# Patient Record
Sex: Male | Born: 1955
Health system: Southern US, Community
[De-identification: ages and names within clinical notes are randomized; demographics above are authoritative.]

## PROBLEM LIST (undated history)

## (undated) DIAGNOSIS — J449 Chronic obstructive pulmonary disease, unspecified: Secondary | ICD-10-CM

## (undated) DIAGNOSIS — M5020 Other cervical disc displacement, unspecified cervical region: Secondary | ICD-10-CM

## (undated) DIAGNOSIS — R7309 Other abnormal glucose: Secondary | ICD-10-CM

## (undated) DIAGNOSIS — M722 Plantar fascial fibromatosis: Secondary | ICD-10-CM

## (undated) DIAGNOSIS — E7800 Pure hypercholesterolemia, unspecified: Secondary | ICD-10-CM

## (undated) DIAGNOSIS — I219 Acute myocardial infarction, unspecified: Secondary | ICD-10-CM

## (undated) DIAGNOSIS — J984 Other disorders of lung: Secondary | ICD-10-CM

## (undated) DIAGNOSIS — J4489 Other specified chronic obstructive pulmonary disease: Secondary | ICD-10-CM

## (undated) DIAGNOSIS — M67919 Unspecified disorder of synovium and tendon, unspecified shoulder: Secondary | ICD-10-CM

## (undated) DIAGNOSIS — I6529 Occlusion and stenosis of unspecified carotid artery: Secondary | ICD-10-CM

## (undated) DIAGNOSIS — F3289 Other specified depressive episodes: Secondary | ICD-10-CM

## (undated) DIAGNOSIS — M719 Bursopathy, unspecified: Secondary | ICD-10-CM

## (undated) DIAGNOSIS — M6740 Ganglion, unspecified site: Secondary | ICD-10-CM

## (undated) DIAGNOSIS — I7389 Other specified peripheral vascular diseases: Secondary | ICD-10-CM

## (undated) DIAGNOSIS — F329 Major depressive disorder, single episode, unspecified: Secondary | ICD-10-CM

## (undated) DIAGNOSIS — J45909 Unspecified asthma, uncomplicated: Secondary | ICD-10-CM

## (undated) DIAGNOSIS — E291 Testicular hypofunction: Secondary | ICD-10-CM

## (undated) DIAGNOSIS — I10 Essential (primary) hypertension: Secondary | ICD-10-CM

## (undated) DIAGNOSIS — F411 Generalized anxiety disorder: Secondary | ICD-10-CM

## (undated) DIAGNOSIS — E049 Nontoxic goiter, unspecified: Secondary | ICD-10-CM

## (undated) DIAGNOSIS — I2510 Atherosclerotic heart disease of native coronary artery without angina pectoris: Secondary | ICD-10-CM

## (undated) DIAGNOSIS — G5600 Carpal tunnel syndrome, unspecified upper limb: Secondary | ICD-10-CM

## (undated) HISTORY — DX: Unspecified asthma, uncomplicated: J45.909

## (undated) HISTORY — DX: Unspecified disorder of synovium and tendon, unspecified shoulder: M67.919

## (undated) HISTORY — DX: Other specified peripheral vascular diseases: I73.89

## (undated) HISTORY — DX: Major depressive disorder, single episode, unspecified: F32.9

## (undated) HISTORY — DX: Occlusion and stenosis of unspecified carotid artery: I65.29

## (undated) HISTORY — DX: Other specified chronic obstructive pulmonary disease: J44.89

## (undated) HISTORY — DX: Acute myocardial infarction, unspecified: I21.9

## (undated) HISTORY — DX: Chronic obstructive pulmonary disease, unspecified: J44.9

## (undated) HISTORY — DX: Testicular hypofunction: E29.1

## (undated) HISTORY — DX: Other specified depressive episodes: F32.89

## (undated) HISTORY — DX: Nontoxic goiter, unspecified: E04.9

## (undated) HISTORY — DX: Other disorders of lung: J98.4

## (undated) HISTORY — DX: Other abnormal glucose: R73.09

## (undated) HISTORY — DX: Carpal tunnel syndrome, unspecified upper limb: G56.00

## (undated) HISTORY — DX: Other cervical disc displacement, unspecified cervical region: M50.20

## (undated) HISTORY — DX: Bursopathy, unspecified: M71.9

## (undated) HISTORY — DX: Ganglion, unspecified site: M67.40

## (undated) HISTORY — PX: CARDIAC CATHETERIZATION: SHX172

## (undated) HISTORY — DX: Plantar fascial fibromatosis: M72.2

## (undated) HISTORY — PX: ANGIOPLASTY / STENTING FEMORAL: SUR30

## (undated) HISTORY — DX: Pure hypercholesterolemia, unspecified: E78.00

## (undated) HISTORY — DX: Generalized anxiety disorder: F41.1

---

## 1998-09-13 ENCOUNTER — Encounter: Payer: Self-pay | Admitting: Emergency Medicine

## 1998-09-13 ENCOUNTER — Emergency Department (HOSPITAL_COMMUNITY): Admission: EM | Admit: 1998-09-13 | Discharge: 1998-09-13 | Payer: Self-pay | Admitting: Emergency Medicine

## 1999-09-02 ENCOUNTER — Emergency Department (HOSPITAL_COMMUNITY): Admission: EM | Admit: 1999-09-02 | Discharge: 1999-09-02 | Payer: Self-pay | Admitting: Emergency Medicine

## 1999-09-07 ENCOUNTER — Emergency Department (HOSPITAL_COMMUNITY): Admission: EM | Admit: 1999-09-07 | Discharge: 1999-09-07 | Payer: Self-pay | Admitting: Emergency Medicine

## 1999-09-07 ENCOUNTER — Encounter: Payer: Self-pay | Admitting: Emergency Medicine

## 2000-02-22 ENCOUNTER — Emergency Department (HOSPITAL_COMMUNITY): Admission: EM | Admit: 2000-02-22 | Discharge: 2000-02-22 | Payer: Self-pay | Admitting: *Deleted

## 2000-02-27 ENCOUNTER — Emergency Department (HOSPITAL_COMMUNITY): Admission: EM | Admit: 2000-02-27 | Discharge: 2000-02-27 | Payer: Self-pay | Admitting: Emergency Medicine

## 2000-03-09 ENCOUNTER — Emergency Department (HOSPITAL_COMMUNITY): Admission: EM | Admit: 2000-03-09 | Discharge: 2000-03-09 | Payer: Self-pay | Admitting: Emergency Medicine

## 2000-03-09 ENCOUNTER — Encounter: Payer: Self-pay | Admitting: Emergency Medicine

## 2000-03-25 ENCOUNTER — Emergency Department (HOSPITAL_COMMUNITY): Admission: EM | Admit: 2000-03-25 | Discharge: 2000-03-25 | Payer: Self-pay | Admitting: Emergency Medicine

## 2000-08-28 ENCOUNTER — Emergency Department (HOSPITAL_COMMUNITY): Admission: EM | Admit: 2000-08-28 | Discharge: 2000-08-28 | Payer: Self-pay | Admitting: *Deleted

## 2000-12-11 ENCOUNTER — Encounter: Payer: Self-pay | Admitting: Emergency Medicine

## 2000-12-11 ENCOUNTER — Emergency Department (HOSPITAL_COMMUNITY): Admission: EM | Admit: 2000-12-11 | Discharge: 2000-12-11 | Payer: Self-pay | Admitting: Emergency Medicine

## 2001-01-08 ENCOUNTER — Encounter: Payer: Self-pay | Admitting: Emergency Medicine

## 2001-01-08 ENCOUNTER — Emergency Department (HOSPITAL_COMMUNITY): Admission: EM | Admit: 2001-01-08 | Discharge: 2001-01-08 | Payer: Self-pay | Admitting: Emergency Medicine

## 2001-08-27 ENCOUNTER — Emergency Department (HOSPITAL_COMMUNITY): Admission: EM | Admit: 2001-08-27 | Discharge: 2001-08-27 | Payer: Self-pay | Admitting: Emergency Medicine

## 2001-08-27 ENCOUNTER — Encounter: Payer: Self-pay | Admitting: Emergency Medicine

## 2002-05-17 ENCOUNTER — Emergency Department (HOSPITAL_COMMUNITY): Admission: EM | Admit: 2002-05-17 | Discharge: 2002-05-17 | Payer: Self-pay | Admitting: Emergency Medicine

## 2002-07-09 ENCOUNTER — Emergency Department (HOSPITAL_COMMUNITY): Admission: EM | Admit: 2002-07-09 | Discharge: 2002-07-09 | Payer: Self-pay | Admitting: Emergency Medicine

## 2002-09-10 ENCOUNTER — Emergency Department (HOSPITAL_COMMUNITY): Admission: EM | Admit: 2002-09-10 | Discharge: 2002-09-10 | Payer: Self-pay | Admitting: Emergency Medicine

## 2005-04-10 ENCOUNTER — Emergency Department (HOSPITAL_COMMUNITY): Admission: EM | Admit: 2005-04-10 | Discharge: 2005-04-10 | Payer: Self-pay | Admitting: Emergency Medicine

## 2006-01-02 ENCOUNTER — Ambulatory Visit: Payer: Self-pay | Admitting: *Deleted

## 2006-01-02 ENCOUNTER — Inpatient Hospital Stay (HOSPITAL_COMMUNITY): Admission: AD | Admit: 2006-01-02 | Discharge: 2006-01-08 | Payer: Self-pay | Admitting: *Deleted

## 2006-01-02 ENCOUNTER — Encounter: Payer: Self-pay | Admitting: Emergency Medicine

## 2006-01-02 DIAGNOSIS — J984 Other disorders of lung: Secondary | ICD-10-CM | POA: Insufficient documentation

## 2006-02-16 ENCOUNTER — Emergency Department (HOSPITAL_COMMUNITY): Admission: EM | Admit: 2006-02-16 | Discharge: 2006-02-16 | Payer: Self-pay | Admitting: Emergency Medicine

## 2006-03-14 ENCOUNTER — Emergency Department (HOSPITAL_COMMUNITY): Admission: EM | Admit: 2006-03-14 | Discharge: 2006-03-14 | Payer: Self-pay | Admitting: Emergency Medicine

## 2006-05-04 ENCOUNTER — Emergency Department (HOSPITAL_COMMUNITY): Admission: EM | Admit: 2006-05-04 | Discharge: 2006-05-04 | Payer: Self-pay | Admitting: Emergency Medicine

## 2006-05-04 DIAGNOSIS — M719 Bursopathy, unspecified: Secondary | ICD-10-CM

## 2006-05-04 DIAGNOSIS — M5020 Other cervical disc displacement, unspecified cervical region: Secondary | ICD-10-CM | POA: Insufficient documentation

## 2006-05-04 DIAGNOSIS — M67919 Unspecified disorder of synovium and tendon, unspecified shoulder: Secondary | ICD-10-CM | POA: Insufficient documentation

## 2006-05-10 ENCOUNTER — Emergency Department (HOSPITAL_COMMUNITY): Admission: EM | Admit: 2006-05-10 | Discharge: 2006-05-10 | Payer: Self-pay | Admitting: Emergency Medicine

## 2006-05-24 ENCOUNTER — Ambulatory Visit: Payer: Self-pay | Admitting: Internal Medicine

## 2006-05-25 ENCOUNTER — Emergency Department (HOSPITAL_COMMUNITY): Admission: EM | Admit: 2006-05-25 | Discharge: 2006-05-25 | Payer: Self-pay | Admitting: Family Medicine

## 2006-06-08 ENCOUNTER — Ambulatory Visit: Payer: Self-pay | Admitting: *Deleted

## 2006-06-14 ENCOUNTER — Ambulatory Visit: Payer: Self-pay | Admitting: Internal Medicine

## 2006-06-18 ENCOUNTER — Ambulatory Visit (HOSPITAL_COMMUNITY): Admission: RE | Admit: 2006-06-18 | Discharge: 2006-06-18 | Payer: Self-pay | Admitting: Internal Medicine

## 2006-06-26 ENCOUNTER — Emergency Department (HOSPITAL_COMMUNITY): Admission: EM | Admit: 2006-06-26 | Discharge: 2006-06-26 | Payer: Self-pay | Admitting: Emergency Medicine

## 2006-06-26 ENCOUNTER — Emergency Department (HOSPITAL_COMMUNITY): Admission: EM | Admit: 2006-06-26 | Discharge: 2006-06-26 | Payer: Self-pay | Admitting: Family Medicine

## 2006-06-28 ENCOUNTER — Ambulatory Visit: Payer: Self-pay | Admitting: Internal Medicine

## 2006-07-03 ENCOUNTER — Ambulatory Visit: Payer: Self-pay | Admitting: Internal Medicine

## 2006-08-04 ENCOUNTER — Emergency Department (HOSPITAL_COMMUNITY): Admission: EM | Admit: 2006-08-04 | Discharge: 2006-08-04 | Payer: Self-pay | Admitting: Family Medicine

## 2006-10-14 ENCOUNTER — Encounter (INDEPENDENT_AMBULATORY_CARE_PROVIDER_SITE_OTHER): Payer: Self-pay | Admitting: Internal Medicine

## 2006-10-14 ENCOUNTER — Inpatient Hospital Stay (HOSPITAL_COMMUNITY): Admission: EM | Admit: 2006-10-14 | Discharge: 2006-10-15 | Payer: Self-pay | Admitting: Emergency Medicine

## 2006-10-15 ENCOUNTER — Encounter (INDEPENDENT_AMBULATORY_CARE_PROVIDER_SITE_OTHER): Payer: Self-pay | Admitting: *Deleted

## 2007-01-01 ENCOUNTER — Encounter (INDEPENDENT_AMBULATORY_CARE_PROVIDER_SITE_OTHER): Payer: Self-pay | Admitting: Internal Medicine

## 2007-01-05 DIAGNOSIS — F1021 Alcohol dependence, in remission: Secondary | ICD-10-CM | POA: Insufficient documentation

## 2007-01-05 DIAGNOSIS — Z8639 Personal history of other endocrine, nutritional and metabolic disease: Secondary | ICD-10-CM

## 2007-01-05 DIAGNOSIS — G4700 Insomnia, unspecified: Secondary | ICD-10-CM | POA: Insufficient documentation

## 2007-01-05 DIAGNOSIS — F17200 Nicotine dependence, unspecified, uncomplicated: Secondary | ICD-10-CM | POA: Insufficient documentation

## 2007-01-05 DIAGNOSIS — F411 Generalized anxiety disorder: Secondary | ICD-10-CM | POA: Insufficient documentation

## 2007-01-05 DIAGNOSIS — Z862 Personal history of diseases of the blood and blood-forming organs and certain disorders involving the immune mechanism: Secondary | ICD-10-CM | POA: Insufficient documentation

## 2007-01-09 ENCOUNTER — Encounter (INDEPENDENT_AMBULATORY_CARE_PROVIDER_SITE_OTHER): Payer: Self-pay | Admitting: *Deleted

## 2007-01-14 ENCOUNTER — Telehealth (INDEPENDENT_AMBULATORY_CARE_PROVIDER_SITE_OTHER): Payer: Self-pay | Admitting: Internal Medicine

## 2007-01-14 ENCOUNTER — Telehealth (INDEPENDENT_AMBULATORY_CARE_PROVIDER_SITE_OTHER): Payer: Self-pay | Admitting: *Deleted

## 2007-01-15 ENCOUNTER — Telehealth (INDEPENDENT_AMBULATORY_CARE_PROVIDER_SITE_OTHER): Payer: Self-pay | Admitting: Internal Medicine

## 2007-01-21 ENCOUNTER — Ambulatory Visit: Payer: Self-pay | Admitting: Nurse Practitioner

## 2007-01-21 DIAGNOSIS — K047 Periapical abscess without sinus: Secondary | ICD-10-CM | POA: Insufficient documentation

## 2007-01-21 DIAGNOSIS — J45909 Unspecified asthma, uncomplicated: Secondary | ICD-10-CM | POA: Insufficient documentation

## 2007-01-21 DIAGNOSIS — R21 Rash and other nonspecific skin eruption: Secondary | ICD-10-CM | POA: Insufficient documentation

## 2007-01-21 LAB — CONVERTED CEMR LAB
Eosinophils Absolute: 0.2 10*3/uL (ref 0.0–0.7)
Eosinophils Relative: 4 % (ref 0–5)
HCT: 46.7 % (ref 39.0–52.0)
Hemoglobin: 15.2 g/dL (ref 13.0–17.0)
Lymphocytes Relative: 32 % (ref 12–46)
Lymphs Abs: 2.2 10*3/uL (ref 0.7–3.3)
MCV: 95.3 fL (ref 78.0–100.0)
Monocytes Relative: 9 % (ref 3–11)
Platelets: 321 10*3/uL (ref 150–400)
RBC: 4.9 M/uL (ref 4.22–5.81)
WBC: 6.9 10*3/uL (ref 4.0–10.5)

## 2007-01-22 ENCOUNTER — Encounter (INDEPENDENT_AMBULATORY_CARE_PROVIDER_SITE_OTHER): Payer: Self-pay | Admitting: Internal Medicine

## 2007-01-28 ENCOUNTER — Telehealth (INDEPENDENT_AMBULATORY_CARE_PROVIDER_SITE_OTHER): Payer: Self-pay | Admitting: *Deleted

## 2007-01-29 ENCOUNTER — Encounter (INDEPENDENT_AMBULATORY_CARE_PROVIDER_SITE_OTHER): Payer: Self-pay | Admitting: Internal Medicine

## 2007-02-13 ENCOUNTER — Ambulatory Visit: Payer: Self-pay | Admitting: Nurse Practitioner

## 2007-02-18 ENCOUNTER — Encounter (INDEPENDENT_AMBULATORY_CARE_PROVIDER_SITE_OTHER): Payer: Self-pay | Admitting: Nurse Practitioner

## 2007-04-07 DIAGNOSIS — F329 Major depressive disorder, single episode, unspecified: Secondary | ICD-10-CM

## 2007-04-07 DIAGNOSIS — F3289 Other specified depressive episodes: Secondary | ICD-10-CM | POA: Insufficient documentation

## 2007-04-29 ENCOUNTER — Telehealth (INDEPENDENT_AMBULATORY_CARE_PROVIDER_SITE_OTHER): Payer: Self-pay | Admitting: Nurse Practitioner

## 2007-05-03 ENCOUNTER — Telehealth (INDEPENDENT_AMBULATORY_CARE_PROVIDER_SITE_OTHER): Payer: Self-pay | Admitting: Internal Medicine

## 2007-05-23 ENCOUNTER — Ambulatory Visit: Payer: Self-pay | Admitting: Internal Medicine

## 2007-05-23 DIAGNOSIS — M722 Plantar fascial fibromatosis: Secondary | ICD-10-CM | POA: Insufficient documentation

## 2007-05-23 DIAGNOSIS — M25559 Pain in unspecified hip: Secondary | ICD-10-CM | POA: Insufficient documentation

## 2007-05-23 LAB — CONVERTED CEMR LAB
Bilirubin Urine: NEGATIVE
Glucose, Urine, Semiquant: NEGATIVE
Protein, U semiquant: NEGATIVE
Specific Gravity, Urine: <1.005
Urobilinogen, UA: NEGATIVE
WBC Urine, dipstick: NEGATIVE

## 2007-05-27 ENCOUNTER — Ambulatory Visit (HOSPITAL_COMMUNITY): Admission: RE | Admit: 2007-05-27 | Discharge: 2007-05-27 | Payer: Self-pay | Admitting: Internal Medicine

## 2007-05-27 ENCOUNTER — Encounter (INDEPENDENT_AMBULATORY_CARE_PROVIDER_SITE_OTHER): Payer: Self-pay | Admitting: Internal Medicine

## 2007-05-27 LAB — CONVERTED CEMR LAB
BUN: 13 mg/dL (ref 6–23)
Chloride: 102 meq/L (ref 96–112)
Creatinine, Ser: 0.81 mg/dL (ref 0.40–1.50)
Glucose, Bld: 122 mg/dL — ABNORMAL HIGH (ref 70–99)
Potassium: 4.5 meq/L (ref 3.5–5.3)

## 2007-05-30 ENCOUNTER — Ambulatory Visit: Payer: Self-pay | Admitting: Internal Medicine

## 2007-08-20 ENCOUNTER — Telehealth (INDEPENDENT_AMBULATORY_CARE_PROVIDER_SITE_OTHER): Payer: Self-pay | Admitting: Internal Medicine

## 2007-09-12 ENCOUNTER — Ambulatory Visit: Payer: Self-pay | Admitting: Internal Medicine

## 2007-09-12 DIAGNOSIS — I10 Essential (primary) hypertension: Secondary | ICD-10-CM | POA: Insufficient documentation

## 2007-09-12 DIAGNOSIS — G5600 Carpal tunnel syndrome, unspecified upper limb: Secondary | ICD-10-CM | POA: Insufficient documentation

## 2007-10-31 ENCOUNTER — Ambulatory Visit: Payer: Self-pay | Admitting: Internal Medicine

## 2007-11-04 ENCOUNTER — Telehealth (INDEPENDENT_AMBULATORY_CARE_PROVIDER_SITE_OTHER): Payer: Self-pay | Admitting: Nurse Practitioner

## 2007-11-04 ENCOUNTER — Ambulatory Visit (HOSPITAL_COMMUNITY): Admission: RE | Admit: 2007-11-04 | Discharge: 2007-11-04 | Payer: Self-pay | Admitting: Internal Medicine

## 2008-01-13 ENCOUNTER — Telehealth (INDEPENDENT_AMBULATORY_CARE_PROVIDER_SITE_OTHER): Payer: Self-pay | Admitting: Internal Medicine

## 2008-01-21 ENCOUNTER — Encounter (INDEPENDENT_AMBULATORY_CARE_PROVIDER_SITE_OTHER): Payer: Self-pay | Admitting: Internal Medicine

## 2008-01-27 ENCOUNTER — Telehealth (INDEPENDENT_AMBULATORY_CARE_PROVIDER_SITE_OTHER): Payer: Self-pay | Admitting: Internal Medicine

## 2008-01-28 ENCOUNTER — Encounter (INDEPENDENT_AMBULATORY_CARE_PROVIDER_SITE_OTHER): Payer: Self-pay | Admitting: Internal Medicine

## 2008-02-11 ENCOUNTER — Encounter: Admission: RE | Admit: 2008-02-11 | Discharge: 2008-02-11 | Payer: Self-pay | Admitting: Neurosurgery

## 2008-02-19 ENCOUNTER — Ambulatory Visit (HOSPITAL_COMMUNITY): Admission: RE | Admit: 2008-02-19 | Discharge: 2008-02-20 | Payer: Self-pay | Admitting: Neurosurgery

## 2008-02-20 ENCOUNTER — Encounter (INDEPENDENT_AMBULATORY_CARE_PROVIDER_SITE_OTHER): Payer: Self-pay | Admitting: *Deleted

## 2008-03-24 ENCOUNTER — Encounter (INDEPENDENT_AMBULATORY_CARE_PROVIDER_SITE_OTHER): Payer: Self-pay | Admitting: Internal Medicine

## 2008-04-23 ENCOUNTER — Ambulatory Visit: Payer: Self-pay | Admitting: Internal Medicine

## 2008-05-06 ENCOUNTER — Telehealth (INDEPENDENT_AMBULATORY_CARE_PROVIDER_SITE_OTHER): Payer: Self-pay | Admitting: Internal Medicine

## 2008-05-22 ENCOUNTER — Ambulatory Visit: Payer: Self-pay | Admitting: Internal Medicine

## 2008-05-22 LAB — CONVERTED CEMR LAB
Nitrite: NEGATIVE
Protein, U semiquant: NEGATIVE
Specific Gravity, Urine: <1.005
WBC Urine, dipstick: NEGATIVE

## 2008-06-06 DIAGNOSIS — E7800 Pure hypercholesterolemia, unspecified: Secondary | ICD-10-CM | POA: Insufficient documentation

## 2008-06-06 LAB — CONVERTED CEMR LAB
CO2: 23 meq/L (ref 19–32)
Calcium: 9.4 mg/dL (ref 8.4–10.5)
Cholesterol: 266 mg/dL — ABNORMAL HIGH (ref 0–200)
Creatinine, Ser: 0.61 mg/dL (ref 0.40–1.50)
Eosinophils Relative: 3 % (ref 0–5)
Glucose, Bld: 111 mg/dL — ABNORMAL HIGH (ref 70–99)
Lymphocytes Relative: 26 % (ref 12–46)
Lymphs Abs: 1.9 10*3/uL (ref 0.7–4.0)
MCHC: 32.1 g/dL (ref 30.0–36.0)
MCV: 94.9 fL (ref 78.0–100.0)
Monocytes Absolute: 0.6 10*3/uL (ref 0.1–1.0)
Monocytes Relative: 8 % (ref 3–12)
Platelets: 310 10*3/uL (ref 150–400)
RDW: 15.3 % (ref 11.5–15.5)
Total Bilirubin: 0.3 mg/dL (ref 0.3–1.2)
Total CHOL/HDL Ratio: 4.3
Triglycerides: 130 mg/dL (ref <150)
VLDL: 26 mg/dL (ref 0–40)

## 2008-06-26 ENCOUNTER — Ambulatory Visit: Payer: Self-pay | Admitting: Internal Medicine

## 2008-06-26 DIAGNOSIS — E049 Nontoxic goiter, unspecified: Secondary | ICD-10-CM | POA: Insufficient documentation

## 2008-06-26 DIAGNOSIS — R7309 Other abnormal glucose: Secondary | ICD-10-CM | POA: Insufficient documentation

## 2008-06-26 DIAGNOSIS — M6740 Ganglion, unspecified site: Secondary | ICD-10-CM | POA: Insufficient documentation

## 2008-06-26 LAB — CONVERTED CEMR LAB
Bilirubin Urine: NEGATIVE
Protein, U semiquant: NEGATIVE
Urobilinogen, UA: 0.2
WBC Urine, dipstick: NEGATIVE

## 2008-06-29 ENCOUNTER — Encounter (INDEPENDENT_AMBULATORY_CARE_PROVIDER_SITE_OTHER): Payer: Self-pay | Admitting: Internal Medicine

## 2008-07-06 ENCOUNTER — Encounter (INDEPENDENT_AMBULATORY_CARE_PROVIDER_SITE_OTHER): Payer: Self-pay | Admitting: Internal Medicine

## 2008-07-10 ENCOUNTER — Ambulatory Visit: Payer: Self-pay | Admitting: Internal Medicine

## 2008-07-13 ENCOUNTER — Telehealth (INDEPENDENT_AMBULATORY_CARE_PROVIDER_SITE_OTHER): Payer: Self-pay | Admitting: Internal Medicine

## 2008-07-13 ENCOUNTER — Emergency Department (HOSPITAL_COMMUNITY): Admission: EM | Admit: 2008-07-13 | Discharge: 2008-07-13 | Payer: Self-pay | Admitting: Emergency Medicine

## 2008-08-18 ENCOUNTER — Ambulatory Visit: Payer: Self-pay | Admitting: Internal Medicine

## 2008-08-18 DIAGNOSIS — M79609 Pain in unspecified limb: Secondary | ICD-10-CM | POA: Insufficient documentation

## 2008-08-27 ENCOUNTER — Ambulatory Visit (HOSPITAL_COMMUNITY): Admission: RE | Admit: 2008-08-27 | Discharge: 2008-08-27 | Payer: Self-pay | Admitting: Internal Medicine

## 2008-08-27 ENCOUNTER — Ambulatory Visit: Payer: Self-pay | Admitting: Vascular Surgery

## 2008-08-27 ENCOUNTER — Encounter (INDEPENDENT_AMBULATORY_CARE_PROVIDER_SITE_OTHER): Payer: Self-pay | Admitting: Internal Medicine

## 2008-08-28 ENCOUNTER — Ambulatory Visit: Payer: Self-pay | Admitting: Internal Medicine

## 2008-08-28 DIAGNOSIS — I7389 Other specified peripheral vascular diseases: Secondary | ICD-10-CM | POA: Insufficient documentation

## 2008-08-28 DIAGNOSIS — M5137 Other intervertebral disc degeneration, lumbosacral region: Secondary | ICD-10-CM | POA: Insufficient documentation

## 2008-09-07 ENCOUNTER — Telehealth (INDEPENDENT_AMBULATORY_CARE_PROVIDER_SITE_OTHER): Payer: Self-pay | Admitting: Internal Medicine

## 2008-09-16 ENCOUNTER — Ambulatory Visit (HOSPITAL_COMMUNITY): Admission: RE | Admit: 2008-09-16 | Discharge: 2008-09-16 | Payer: Self-pay | Admitting: Internal Medicine

## 2008-09-27 ENCOUNTER — Ambulatory Visit: Payer: Self-pay | Admitting: *Deleted

## 2008-09-27 ENCOUNTER — Inpatient Hospital Stay (HOSPITAL_COMMUNITY): Admission: EM | Admit: 2008-09-27 | Discharge: 2008-09-29 | Payer: Self-pay | Admitting: Emergency Medicine

## 2008-09-27 DIAGNOSIS — I219 Acute myocardial infarction, unspecified: Secondary | ICD-10-CM | POA: Insufficient documentation

## 2008-09-27 DIAGNOSIS — I2510 Atherosclerotic heart disease of native coronary artery without angina pectoris: Secondary | ICD-10-CM | POA: Insufficient documentation

## 2008-09-30 ENCOUNTER — Telehealth (INDEPENDENT_AMBULATORY_CARE_PROVIDER_SITE_OTHER): Payer: Self-pay | Admitting: Internal Medicine

## 2008-10-01 ENCOUNTER — Telehealth (INDEPENDENT_AMBULATORY_CARE_PROVIDER_SITE_OTHER): Payer: Self-pay | Admitting: Internal Medicine

## 2008-10-07 ENCOUNTER — Ambulatory Visit: Payer: Self-pay | Admitting: Family Medicine

## 2008-10-13 ENCOUNTER — Encounter (INDEPENDENT_AMBULATORY_CARE_PROVIDER_SITE_OTHER): Payer: Self-pay | Admitting: Internal Medicine

## 2008-10-30 ENCOUNTER — Ambulatory Visit: Payer: Self-pay | Admitting: Internal Medicine

## 2008-10-30 LAB — CONVERTED CEMR LAB
ALT: 12 units/L (ref 0–53)
Albumin: 4.3 g/dL (ref 3.5–5.2)
CO2: 22 meq/L (ref 19–32)
Calcium: 9.4 mg/dL (ref 8.4–10.5)
Chloride: 104 meq/L (ref 96–112)
Cholesterol: 112 mg/dL (ref 0–200)
Glucose, Bld: 99 mg/dL (ref 70–99)
Sodium: 140 meq/L (ref 135–145)
Total Bilirubin: 0.3 mg/dL (ref 0.3–1.2)
Total Protein: 7.7 g/dL (ref 6.0–8.3)
Triglycerides: 101 mg/dL (ref <150)
VLDL: 20 mg/dL (ref 0–40)

## 2008-11-05 ENCOUNTER — Encounter (HOSPITAL_COMMUNITY): Admission: RE | Admit: 2008-11-05 | Discharge: 2008-12-23 | Payer: Self-pay | Admitting: Cardiology

## 2008-11-16 ENCOUNTER — Encounter (INDEPENDENT_AMBULATORY_CARE_PROVIDER_SITE_OTHER): Payer: Self-pay | Admitting: Internal Medicine

## 2008-11-24 ENCOUNTER — Encounter (INDEPENDENT_AMBULATORY_CARE_PROVIDER_SITE_OTHER): Payer: Self-pay | Admitting: Internal Medicine

## 2008-12-31 ENCOUNTER — Ambulatory Visit: Payer: Self-pay | Admitting: Internal Medicine

## 2008-12-31 LAB — CONVERTED CEMR LAB
Basophils Absolute: 0 10*3/uL (ref 0.0–0.1)
Basophils Relative: 0 % (ref 0–1)
Eosinophils Absolute: 0.3 10*3/uL (ref 0.0–0.7)
Eosinophils Relative: 3 % (ref 0–5)
HCT: 43 % (ref 39.0–52.0)
Hemoglobin: 14.2 g/dL (ref 13.0–17.0)
MCHC: 33 g/dL (ref 30.0–36.0)
MCV: 92.5 fL (ref 78.0–100.0)
Monocytes Absolute: 0.9 10*3/uL (ref 0.1–1.0)
Monocytes Relative: 12 % (ref 3–12)
Prothrombin Time: 13.2 s (ref 11.6–15.2)
RBC: 4.65 M/uL (ref 4.22–5.81)
RDW: 14.5 % (ref 11.5–15.5)

## 2009-01-18 ENCOUNTER — Encounter (INDEPENDENT_AMBULATORY_CARE_PROVIDER_SITE_OTHER): Payer: Self-pay | Admitting: Internal Medicine

## 2009-02-26 ENCOUNTER — Ambulatory Visit: Payer: Self-pay | Admitting: Internal Medicine

## 2009-02-26 DIAGNOSIS — M25579 Pain in unspecified ankle and joints of unspecified foot: Secondary | ICD-10-CM | POA: Insufficient documentation

## 2009-03-03 ENCOUNTER — Encounter (INDEPENDENT_AMBULATORY_CARE_PROVIDER_SITE_OTHER): Payer: Self-pay | Admitting: Internal Medicine

## 2009-03-04 ENCOUNTER — Encounter (INDEPENDENT_AMBULATORY_CARE_PROVIDER_SITE_OTHER): Payer: Self-pay | Admitting: Internal Medicine

## 2009-03-05 ENCOUNTER — Encounter (HOSPITAL_COMMUNITY): Admission: RE | Admit: 2009-03-05 | Discharge: 2009-04-21 | Payer: Self-pay | Admitting: Cardiology

## 2009-03-12 ENCOUNTER — Ambulatory Visit: Payer: Self-pay | Admitting: Internal Medicine

## 2009-04-10 ENCOUNTER — Emergency Department (HOSPITAL_COMMUNITY): Admission: EM | Admit: 2009-04-10 | Discharge: 2009-04-10 | Payer: Self-pay | Admitting: Emergency Medicine

## 2009-05-03 ENCOUNTER — Encounter (INDEPENDENT_AMBULATORY_CARE_PROVIDER_SITE_OTHER): Payer: Self-pay | Admitting: Internal Medicine

## 2009-05-18 ENCOUNTER — Ambulatory Visit: Payer: Self-pay | Admitting: Internal Medicine

## 2009-05-18 DIAGNOSIS — R252 Cramp and spasm: Secondary | ICD-10-CM | POA: Insufficient documentation

## 2009-05-18 DIAGNOSIS — N529 Male erectile dysfunction, unspecified: Secondary | ICD-10-CM | POA: Insufficient documentation

## 2009-05-21 ENCOUNTER — Telehealth (INDEPENDENT_AMBULATORY_CARE_PROVIDER_SITE_OTHER): Payer: Self-pay | Admitting: Internal Medicine

## 2009-05-25 ENCOUNTER — Telehealth (INDEPENDENT_AMBULATORY_CARE_PROVIDER_SITE_OTHER): Payer: Self-pay | Admitting: Internal Medicine

## 2009-07-19 ENCOUNTER — Telehealth (INDEPENDENT_AMBULATORY_CARE_PROVIDER_SITE_OTHER): Payer: Self-pay | Admitting: Internal Medicine

## 2009-07-29 ENCOUNTER — Ambulatory Visit: Payer: Self-pay | Admitting: Internal Medicine

## 2009-08-30 ENCOUNTER — Encounter (INDEPENDENT_AMBULATORY_CARE_PROVIDER_SITE_OTHER): Payer: Self-pay | Admitting: Internal Medicine

## 2009-10-26 ENCOUNTER — Ambulatory Visit: Payer: Self-pay | Admitting: Internal Medicine

## 2009-10-27 ENCOUNTER — Encounter (INDEPENDENT_AMBULATORY_CARE_PROVIDER_SITE_OTHER): Payer: Self-pay | Admitting: Internal Medicine

## 2009-10-29 ENCOUNTER — Telehealth (INDEPENDENT_AMBULATORY_CARE_PROVIDER_SITE_OTHER): Payer: Self-pay | Admitting: Internal Medicine

## 2009-10-29 ENCOUNTER — Encounter (INDEPENDENT_AMBULATORY_CARE_PROVIDER_SITE_OTHER): Payer: Self-pay | Admitting: Internal Medicine

## 2009-10-29 DIAGNOSIS — J449 Chronic obstructive pulmonary disease, unspecified: Secondary | ICD-10-CM | POA: Insufficient documentation

## 2009-11-02 ENCOUNTER — Encounter (INDEPENDENT_AMBULATORY_CARE_PROVIDER_SITE_OTHER): Payer: Self-pay | Admitting: Internal Medicine

## 2009-11-29 ENCOUNTER — Encounter (HOSPITAL_COMMUNITY): Admission: RE | Admit: 2009-11-29 | Discharge: 2010-01-19 | Payer: Self-pay | Admitting: Cardiology

## 2009-12-02 ENCOUNTER — Telehealth (INDEPENDENT_AMBULATORY_CARE_PROVIDER_SITE_OTHER): Payer: Self-pay | Admitting: Internal Medicine

## 2009-12-03 ENCOUNTER — Encounter (INDEPENDENT_AMBULATORY_CARE_PROVIDER_SITE_OTHER): Payer: Self-pay | Admitting: Internal Medicine

## 2009-12-09 ENCOUNTER — Telehealth (INDEPENDENT_AMBULATORY_CARE_PROVIDER_SITE_OTHER): Payer: Self-pay | Admitting: Internal Medicine

## 2010-01-06 ENCOUNTER — Ambulatory Visit: Payer: Self-pay | Admitting: Internal Medicine

## 2010-01-06 DIAGNOSIS — J069 Acute upper respiratory infection, unspecified: Secondary | ICD-10-CM | POA: Insufficient documentation

## 2010-01-06 DIAGNOSIS — R5383 Other fatigue: Secondary | ICD-10-CM

## 2010-01-06 DIAGNOSIS — R059 Cough, unspecified: Secondary | ICD-10-CM | POA: Insufficient documentation

## 2010-01-06 DIAGNOSIS — R05 Cough: Secondary | ICD-10-CM | POA: Insufficient documentation

## 2010-01-06 DIAGNOSIS — R5381 Other malaise: Secondary | ICD-10-CM | POA: Insufficient documentation

## 2010-01-06 LAB — CONVERTED CEMR LAB
Albumin: 4.3 g/dL (ref 3.5–5.2)
Alkaline Phosphatase: 63 units/L (ref 39–117)
BUN: 10 mg/dL (ref 6–23)
Basophils Relative: 0 % (ref 0–1)
Calcium: 9.2 mg/dL (ref 8.4–10.5)
Chloride: 106 meq/L (ref 96–112)
Creatinine, Ser: 0.77 mg/dL (ref 0.40–1.50)
Eosinophils Absolute: 0.3 10*3/uL (ref 0.0–0.7)
Glucose, Bld: 75 mg/dL (ref 70–99)
Hemoglobin: 13.3 g/dL (ref 13.0–17.0)
Lymphs Abs: 2 10*3/uL (ref 0.7–4.0)
MCHC: 33.9 g/dL (ref 30.0–36.0)
MCV: 88.5 fL (ref 78.0–100.0)
Monocytes Absolute: 0.8 10*3/uL (ref 0.1–1.0)
Monocytes Relative: 10 % (ref 3–12)
Neutrophils Relative %: 61 % (ref 43–77)
Potassium: 4.4 meq/L (ref 3.5–5.3)
RBC: 4.43 M/uL (ref 4.22–5.81)
TSH: 1.095 microintl units/mL (ref 0.350–4.500)
Testosterone: 171.46 ng/dL — ABNORMAL LOW (ref 350–890)
WBC: 7.7 10*3/uL (ref 4.0–10.5)

## 2010-01-19 ENCOUNTER — Encounter (INDEPENDENT_AMBULATORY_CARE_PROVIDER_SITE_OTHER): Payer: Self-pay | Admitting: Internal Medicine

## 2010-01-25 ENCOUNTER — Telehealth (INDEPENDENT_AMBULATORY_CARE_PROVIDER_SITE_OTHER): Payer: Self-pay | Admitting: Internal Medicine

## 2010-03-08 ENCOUNTER — Ambulatory Visit (HOSPITAL_COMMUNITY): Admission: RE | Admit: 2010-03-08 | Discharge: 2010-03-08 | Payer: Self-pay | Admitting: Internal Medicine

## 2010-04-29 ENCOUNTER — Ambulatory Visit
Admission: RE | Admit: 2010-04-29 | Discharge: 2010-04-29 | Payer: Self-pay | Source: Home / Self Care | Attending: Internal Medicine | Admitting: Internal Medicine

## 2010-04-29 ENCOUNTER — Encounter (INDEPENDENT_AMBULATORY_CARE_PROVIDER_SITE_OTHER): Payer: Self-pay | Admitting: Internal Medicine

## 2010-04-29 ENCOUNTER — Telehealth (INDEPENDENT_AMBULATORY_CARE_PROVIDER_SITE_OTHER): Payer: Self-pay | Admitting: Internal Medicine

## 2010-04-29 ENCOUNTER — Telehealth (INDEPENDENT_AMBULATORY_CARE_PROVIDER_SITE_OTHER): Payer: Self-pay | Admitting: *Deleted

## 2010-04-29 DIAGNOSIS — E291 Testicular hypofunction: Secondary | ICD-10-CM | POA: Insufficient documentation

## 2010-04-29 LAB — CONVERTED CEMR LAB
Glucose, Urine, Semiquant: NEGATIVE
Ketones, urine, test strip: NEGATIVE
Nitrite: NEGATIVE
Specific Gravity, Urine: <1.005
WBC Urine, dipstick: NEGATIVE
pH: 5

## 2010-05-02 ENCOUNTER — Encounter (INDEPENDENT_AMBULATORY_CARE_PROVIDER_SITE_OTHER): Payer: Self-pay | Admitting: *Deleted

## 2010-05-02 ENCOUNTER — Encounter (INDEPENDENT_AMBULATORY_CARE_PROVIDER_SITE_OTHER): Payer: Self-pay | Admitting: Internal Medicine

## 2010-05-03 ENCOUNTER — Encounter (INDEPENDENT_AMBULATORY_CARE_PROVIDER_SITE_OTHER): Payer: Self-pay | Admitting: *Deleted

## 2010-05-09 ENCOUNTER — Ambulatory Visit
Admission: RE | Admit: 2010-05-09 | Discharge: 2010-05-09 | Payer: Self-pay | Source: Home / Self Care | Attending: Gastroenterology | Admitting: Gastroenterology

## 2010-05-09 ENCOUNTER — Encounter (INDEPENDENT_AMBULATORY_CARE_PROVIDER_SITE_OTHER): Payer: Self-pay | Admitting: *Deleted

## 2010-05-15 ENCOUNTER — Encounter: Payer: Self-pay | Admitting: Cardiology

## 2010-05-16 ENCOUNTER — Encounter: Payer: Self-pay | Admitting: Internal Medicine

## 2010-05-26 ENCOUNTER — Ambulatory Visit: Admit: 2010-05-26 | Payer: Self-pay | Admitting: Gastroenterology

## 2010-05-26 ENCOUNTER — Ambulatory Visit: Payer: Self-pay | Admitting: Gastroenterology

## 2010-05-26 NOTE — Letter (Signed)
Summary: MAILED REQUESTED RECORDS TO [facility]  MAILED REQUESTED RECORDS TO [facility]   Imported By: Arta Bruce 05/03/2009 16:54:18  _____________________________________________________________________  External Attachment:    Type:   Image     Comment:   External Document

## 2010-05-26 NOTE — Letter (Signed)
Summary: MAILED REQUESTED RECORDS TO [facility]  MAILED REQUESTED RECORDS TO [facility]   Imported By: Arta Bruce 12/03/2009 11:32:33  _____________________________________________________________________  External Attachment:    Type:   Image     Comment:   External Document

## 2010-05-26 NOTE — Progress Notes (Signed)
   Phone Note Outgoing Call   Call placed by: Julieanne Manson MD,  October 29, 2009 5:09 PM Summary of Call: Called pt. to see if ever on just an inhaled steroid alone--no he has not.  Discussed will see if preauthorization goes through, but may need to switch to Qvar. Initial call taken by: Julieanne Manson MD,  October 29, 2009 5:10 PM

## 2010-05-26 NOTE — Progress Notes (Signed)
   Phone Note From Pharmacy   Caller: [facility] Summary of Call: Request for medication clarification regarding Zoloft and Wellbutrin--pt. did not tolerate Wellbutrin and was stopped--will fax to pharmacy Initial call taken by: Julieanne Manson MD,  May 21, 2009 8:14 AM

## 2010-05-26 NOTE — Letter (Signed)
Summary: *HSN Results Follow up  [facility]  [address] [place], [place] [address]   Phone: [phone]  Fax: [phone]      01/19/2010   [name] [facility] [address] [place], [place]  [address]   Dear  [name],                            ____S.Drinkard,FNP   ____D. Gore,FNP       ____B. McPherson,MD   ____V. Rankins,MD    __X__E. Mulberry,MD    ____N. Daphine Deutscher, FNP  ____D. Reche Dixon, MD    ____K. Philipp Deputy, MD    ____Other     This letter is to inform you that your recent test(s):  _______Pap Smear    ____X___Lab Test     _______X-ray    _______ is within acceptable limits  _______ requires a medication change  _______ requires a follow-up lab visit  ___X____ requires a follow-up visit with your Francoise Chojnowski   Comments:  Your labs were all normal except for your testosterone level, which is low.  We will need to discuss treatment at your physical before starting hormone replacement as there are some risks with replacing the hormone and you will need regular lab follow up as well.       _________________________________________________________ If you have any questions, please contact our office                     Sincerely,  Julieanne Manson MD [facility]

## 2010-05-26 NOTE — Progress Notes (Signed)
Summary: Viagra  Medications Added VIAGRA 50 MG TABS (SILDENAFIL CITRATE) 1-2 tabs by mouth 1 hour prior to activity.  Do not exceed 2 tabs in 24 hours       Phone Note Call from Patient   Summary of Call: The pt states that he needs to get a viagra prescription and according to the pt Dr. Carolanne Grumbling authorized.  Public affairs consultant).  If you have any further question, you can reach to the pt at [phone]. Mulberry MD  Initial call taken by: Manon Hilding,  December 02, 2009 4:12 PM  Follow-up for Phone Call        Will fwd to Dr. Delrae Alfred for Berkley Harvey.  (was sent to another Dr.'s desktop for some reason) Follow-up by: Vesta Mixer CMA,  December 07, 2009 2:54 PM  Additional Follow-up for Phone Call Additional follow up Details #1::        Please call Dr. Norris Cross office for confirmation that this is okay Additional Follow-up by: Julieanne Manson MD,  December 08, 2009 9:50 AM    Additional Follow-up for Phone Call Additional follow up Details #2::    left msg for Dr. Norris Cross nurse to call and confirm Viagra at [phone]. Follow-up by: Vesta Mixer CMA,  December 08, 2009 12:25 PM  Additional Follow-up for Phone Call Additional follow up Details #3:: Details for Additional Follow-up Action Taken: Call back from [place] at Dr. Norris Cross office and she did confirm Dr. Mayford Knife said ok for pt to take Viagra. ....... Elmarie Shiley McCoy CMA  December 08, 2009 3:40 PM   New/Updated Medications: VIAGRA 50 MG TABS (SILDENAFIL CITRATE) 1-2 tabs by mouth 1 hour prior to activity.  Do not exceed 2 tabs in 24 hours Prescriptions: VIAGRA 50 MG TABS (SILDENAFIL CITRATE) 1-2 tabs by mouth 1 hour prior to activity.  Do not exceed 2 tabs in 24 hours  #10 x 4   Entered and Authorized by:   Julieanne Manson MD   Signed by:   Julieanne Manson MD on 12/08/2009   Method used:   Electronically to        [facility]* (retail)       [address] [place], [place]  [address]   Ph: [phone] or [phone]       Fax: [phone]   RxID:   [id number]   Appended Document: Viagra Pt. called to confirm clearance from Dr. Mayford Knife.  Advised of clearance and Rx sent.  Dutch Quint RN  December 09, 2009 9:54 AM

## 2010-05-26 NOTE — Progress Notes (Signed)
Summary: GI referral  Phone Note Outgoing Call   Summary of Call: Nora--referral for screening colonoscopy Initial call taken by: Julieanne Manson MD,  April 29, 2010 10:46 AM  Follow-up for Phone Call        PT HAS AN APPT [facility]  1APPT NURSE 05-09-10 @ 1PM ,2APPT PROCEDURE  05-20-10 @ 2:30PM LVM TO PT TO CALL ME BACK   Follow-up by: Cheryll Dessert,  May 02, 2010 9:33 AM

## 2010-05-26 NOTE — Assessment & Plan Note (Signed)
Summary: back pain, leg pain, /lr   Vital Signs:  Patient profile:   55 year old male Height:      66 inches Weight:      150 pounds BMI:     24.30 Temp:     97.4 degrees F oral Pulse rate:   68 / minute Pulse rhythm:   regular Resp:     18 per minute BP sitting:   110 / 64  (left arm) Cuff size:   regular  Vitals Entered By: CMA Student Linzie Collin CC: office visit  lower back and right leg pain for about two weeks, and muscle spasms for about for a week, sinus irritated. Is Patient Diabetic? No Pain Assessment Patient in pain? yes     Location: hip and rightn legs Intensity: 6 Type: sharp Onset of pain  Intermittent  Does patient need assistance? Functional Status Self care Ambulation Normal   CC:  office visit  lower back and right leg pain for about two weeks, and muscle spasms for about for a week, and sinus irritated.Marland Kitchen  History of Present Illness: 1.  Right low back and right leg pain --worse it's been in some time.  Has been about 2 weeks.  Started having muscle spasms in thighs, calves and feet--but the spasms are bilateral.  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    2.  Sinus congestion and drainage--having sinus pain.  Started yesterday.  Has had a cough that is increased in the past month.  Partner states he coughs a lot at night in past month.  Still smoking.  Plans to quit this Monday.   No dyspnea or lower respiratory symptoms.  No significant sneezing, itchy, watery eyes and nose.  3.  Would like to have testosterone checked with fatigue and ED symptoms.  Current Medications (verified): 1)  Symbicort 160-4.5 Mcg/act  Aero (Budesonide-Formoterol Fumarate) .Marland Kitchen.. 1 Inhalation Two Times A Day For Breathing 2)  Neurontin 600 Mg  Tabs (Gabapentin) .Marland Kitchen.. 1 Tab By Mouth Three Times A Day 3)  Amlodipine Besylate 5 Mg Tabs (Amlodipine Besylate) .Marland Kitchen.. 1 By Mouth Once  Daily 4)  Adult Aspirin Low Strength 81 Mg Tbdp (Aspirin) .Marland Kitchen.. 1 Tab By Mouth Daily 5)  Lopressor 50 Mg Tabs (Metoprolol Tartrate) .... 1/2 Two Times A Day 6)  Altace 5 Mg Tabs (Ramipril) .Marland Kitchen.. 1 By Mouth Once Daily 7)  Plavix 75 Mg Tabs (Clopidogrel Bisulfate) .... One Pill Daily 8)  Crestor 40 Mg Tabs (Rosuvastatin Calcium) .Marland Kitchen.. 1 Tab By Mouth Daily 9)  Proventil Hfa 108 (90 Base) Mcg/act Aers (Albuterol Sulfate) .... 2 Puffs Every 4 Hours As Needed For Wheezing 10)  Suboxone 8-2 Mg Subl (Buprenorphine Hcl-Naloxone Hcl) .Marland Kitchen.. 1 By Mouth Two Times A Day 11)  Viagra 50 Mg Tabs (Sildenafil Citrate) .Marland Kitchen.. 1-2 Tabs By Mouth 1 Hour Prior To Activity.  Do Not Exceed 2 Tabs in 24 Hours  Allergies (verified): 1)  Nsaids  Physical Exam  Head:  NT over sinuses--frontal and maxillary Eyes:  No corneal or conjunctival inflammation noted. EOMI. Perrla. Funduscopic exam benign, without hemorrhages, exudates or papilledema. Vision grossly normal. Ears:  External ear exam shows no significant lesions or deformities.  Otoscopic examination reveals clear canals, tympanic membranes are intact bilaterally without bulging, retraction, inflammation or discharge. Hearing is grossly normal bilaterally. Nose:  clear nasal discharge with mucosal pallor.   Mouth:  pharynx pink and moist.   Neck:  No deformities, masses, or tenderness noted. Lungs:  Normal respiratory effort, chest expands symmetrically. Lungs are clear to auscultation, no crackles or wheezes. Heart:  Normal rate and regular rhythm. S1 and S2 normal without gallop, murmur, click, rub or other extra sounds.   Impression & Recommendations:  Problem # 1:  COUGH (ICD-786.2) Likely just URI, but with smoking history, check CXR. Stop smoking. Start Cetirizine. Orders: Diagnostic X-Ray/Fluoroscopy (Diagnostic X-Ray/Flu)  Problem # 2:  UPPER RESPIRATORY INFECTION, ACUTE (ICD-465.9)  See above  His updated medication list for this problem includes:     Adult Aspirin Low Strength 81 Mg Tbdp (Aspirin) .Marland Kitchen... 1 tab by mouth daily    Cetirizine Hcl 10 Mg Tabs (Cetirizine hcl) .Marland Kitchen... 1 tab by mouth daily as needed congestion and cough  Problem # 3:  LEG CRAMPS (ICD-729.82) Avoiding opiates. Discussed warm packing and home exercise/stretch program. Check Electrolytes.  Problem # 4:  FATIGUE (ICD-780.79)  Orders: T-Testosterone; Total [phone])  Complete Medication List: 1)  Symbicort 160-4.5 Mcg/act Aero (Budesonide-formoterol fumarate) .Marland Kitchen.. 1 inhalation two times a day for breathing 2)  Neurontin 600 Mg Tabs (Gabapentin) .Marland Kitchen.. 1 tab by mouth three times a day 3)  Amlodipine Besylate 5 Mg Tabs (Amlodipine besylate) .Marland Kitchen.. 1 by mouth once daily 4)  Adult Aspirin Low Strength 81 Mg Tbdp (Aspirin) .Marland Kitchen.. 1 tab by mouth daily 5)  Lopressor 50 Mg Tabs (Metoprolol tartrate) .... 1/2 two times a day 6)  Altace 5 Mg Tabs (Ramipril) .Marland Kitchen.. 1 by mouth once daily 7)  Plavix 75 Mg Tabs (Clopidogrel bisulfate) .... One pill daily 8)  Crestor 40 Mg Tabs (Rosuvastatin calcium) .Marland Kitchen.. 1 tab by mouth daily 9)  Proventil Hfa 108 (90 Base) Mcg/act Aers (Albuterol sulfate) .... 2 puffs every 4 hours as needed for wheezing 10)  Suboxone 8-2 Mg Subl (Buprenorphine hcl-naloxone hcl) .Marland Kitchen.. 1 by mouth two times a day 11)  Viagra 50 Mg Tabs (Sildenafil citrate) .Marland Kitchen.. 1-2 tabs by mouth 1 hour prior to activity.  do not exceed 2 tabs in 24 hours 12)  Cetirizine Hcl 10 Mg Tabs (Cetirizine hcl) .Marland Kitchen.. 1 tab by mouth daily as needed congestion and cough  Other Orders: T-Comprehensive Metabolic Panel ([id number]) T-CBC w/Diff ([id number]) T-CK Total [phone]) T-TSH ([id number])  Patient Instructions: 1)  Smoking cessation class:  [phone] or [phone] 2)  Keep CPE appt. with Dr. Delrae Alfred 3)  saline nasal spray as needed. Prescriptions: CETIRIZINE HCL 10 MG TABS (CETIRIZINE HCL) 1 tab by mouth daily as needed congestion and cough  #30 x 6   Entered and Authorized by:    Julieanne Manson MD   Signed by:   Julieanne Manson MD on 01/06/2010   Method used:   Electronically to        [facility] [address] [phone]* (retail)       [address] [place], [place]  [address]       Ph: [phone]       Fax: [phone]   RxID:   [id number]

## 2010-05-26 NOTE — Progress Notes (Signed)
Summary: Lab results need from [facility]  Phone Note Outgoing Call   Summary of Call: [name]--please call Dr. Yolanda Bonine office [facility]) and have them fax his last labs there. Initial call taken by: Julieanne Manson MD,  April 29, 2010 10:47 AM  Follow-up for Phone Call        Left message @ Medical Records @ [facility] to fax lab results. Shelia please let me know when you receive lab results. Gaylyn Cheers RN  May 02, 2010 11:31 AM   Additional Follow-up for Phone Call Additional follow up Details #1::        put on your desk Additional Follow-up by: Arta Bruce,  May 02, 2010 1:32 PM

## 2010-05-26 NOTE — Letter (Signed)
Summary: FLP/LIVER PROFILE/[facility]  FLP/LIVER PROFILE/[facility]   Imported By: Arta Bruce 05/11/2010 12:43:29  _____________________________________________________________________  External Attachment:    Type:   Image     Comment:   External Document

## 2010-05-26 NOTE — Letter (Signed)
Summary: AGREEMENT FOR CONTROLLED PRESCRITIONS  AGREEMENT FOR CONTROLLED PRESCRITIONS   Imported By: Arta Bruce 08/30/2009 12:24:30  _____________________________________________________________________  External Attachment:    Type:   Image     Comment:   External Document

## 2010-05-26 NOTE — Letter (Signed)
Summary: AMANDA'S SUMMARY  AMANDA'S SUMMARY   Imported By: Arta Bruce 11/12/2009 14:29:21  _____________________________________________________________________  External Attachment:    Type:   Image     Comment:   External Document

## 2010-05-26 NOTE — Letter (Signed)
Summary: *Referral Letter  [facility]  [address] [place], [place] [address]   Phone: [phone]  Fax: [phone]    05/18/2009  Dear Dr.Turner:   [name] was in recently.  We are working on his anxiety/insomnia with meds(Zoloft) and counseling.  He is smoking more as well secondary to anxiety.  Chantix did not help in the past and he did not tolerate Wellbutrin.  I have encouraged him to try the nicotine patches and to get signed up for the smoking cessation class at [facility].    He was also interested in medication for erectile dysfunction.  I do not have his cardiolite stress testing from November and wanted to clear the use of Viagra with you before proceeding.     [facility] [address] [place], [place]  [address]  Phone: [phone]   Current Medical Problems: 1)  ANKLE PAIN, RIGHT (ICD-719.47) 2)  BRUISING (ICD-287.2) 3)  ENCOUNTER FOR LONG-TERM USE OF OTHER MEDICATIONS (ICD-V58.69) 4)  CAD (ICD-414.00) 5)  MI (ICD-410.90) 6)  DEGENERATIVE DISC DISEASE, LUMBOSACRAL SPINE (ICD-722.52) 7)  PERIPHERAL VASCULAR DISEASE WITH CLAUDICATION, LEFT LEG (ICD-443.89) 8)  LEG PAIN, BILATERAL (ICD-729.5) 9)  THYROMEGALY (ICD-240.9) 10)  GANGLION CYST, WRIST, LEFT (ICD-727.41) 11)  HYPERGLYCEMIA (ICD-790.29) 12)  HEALTH MAINTENANCE EXAM (ICD-V70.0) 13)  HYPERCHOLESTEROLEMIA (ICD-272.0) 14)  CRACKLES RIGHT LOWER LUNG FIELD (ICD-793.1) 15)  CARPAL TUNNEL SYNDROME, RIGHT (ICD-354.0) 16)  RIGHT GREATER TROCHANTERIC BURSITIS (ICD-726.5) 17)  ESSENTIAL HYPERTENSION (ICD-401.9) 18)  PLANTAR FASCIITIS, LEFT (ICD-728.71) 19)  PAIN IN JOINT PELVIC REGION AND THIGH (ICD-719.45) 20)  DEPRESSION (ICD-311) 21)  SKIN RASH (ICD-782.1) 22)  REACTIVE AIRWAY DISEASE (ICD-493.90) 23)  ABSCESS, TOOTH (ICD-522.5) 24)  TOBACCO ABUSE (ICD-305.1) 25)  LIVER FUNCTION TESTS, ABNORMAL, HX OF (ICD-V12.2) 26)  INSOMNIA (ICD-780.52) 27)  ALCOHOL ABUSE, HX OF  (ICD-V11.3) 28)  ANXIETY (ICD-300.00) 29)  LUNG NODULES (ICD-518.89) 30)  ROTATOR CUFF SYNDROME, LEFT (ICD-726.10) 31)  DEGENERATIVE DISC DISEASE, CERVICAL SPINE, W/RADICULOPATHY (ICD-722.0)   Current Medications: 1)  SYMBICORT 160-4.5 MCG/ACT  AERO (BUDESONIDE-FORMOTEROL FUMARATE) 1 inhalation two times a day for breathing 2)  NEURONTIN 600 MG  TABS (GABAPENTIN) 1 tab by mouth three times a day 3)  HYDROCODONE-ACETAMINOPHEN 10-500 MG  TABS (HYDROCODONE-ACETAMINOPHEN) 1 q 8hours as needed pain 4)  AMLODIPINE BESYLATE 5 MG TABS (AMLODIPINE BESYLATE) 1 by mouth once daily 5)  ADULT ASPIRIN LOW STRENGTH 81 MG TBDP (ASPIRIN) 1 tab by mouth daily 6)  LOPRESSOR 50 MG TABS (METOPROLOL TARTRATE) 1/2 two times a day 7)  ALTACE 5 MG TABS (RAMIPRIL) 1 by mouth once daily 8)  PLAVIX 75 MG TABS (CLOPIDOGREL BISULFATE) one pill daily 9)  CRESTOR 40 MG TABS (ROSUVASTATIN CALCIUM) 1 tab by mouth daily 10)  LISINOPRIL 10 MG TABS (LISINOPRIL) 1 by mouth once daily 11)  PROVENTIL HFA 108 (90 BASE) MCG/ACT AERS (ALBUTEROL SULFATE) 2 puffs every 4 hours as needed for wheezing   Past Medical History: 1)  HYPERCHOLESTEROLEMIA (ICD-272.0) 2)  CRACKLES RIGHT LOWER LUNG FIELD (ICD-793.1) 3)  CARPAL TUNNEL SYNDROME, RIGHT (ICD-354.0) 4)  RIGHT GREATER TROCHANTERIC BURSITIS (ICD-726.5) 5)  ESSENTIAL HYPERTENSION (ICD-401.9) 6)  PLANTAR FASCIITIS, LEFT (ICD-728.71) 7)  PAIN IN JOINT PELVIC REGION AND THIGH (ICD-719.45) 8)  DEPRESSION (ICD-311) 9)  SKIN RASH (ICD-782.1) 10)  REACTIVE AIRWAY DISEASE (ICD-493.90) 11)  ABSCESS, TOOTH (ICD-522.5) 12)  TOBACCO ABUSE (ICD-305.1) 13)  LIVER FUNCTION TESTS, ABNORMAL, HX OF (ICD-V12.2) 14)  INSOMNIA (ICD-780.52) 15)  ALCOHOL ABUSE, HX OF (ICD-V11.3) 16)  ANXIETY (ICD-300.00) 17)  LUNG NODULES (ICD-518.89) 18)  ROTATOR CUFF SYNDROME, LEFT (ICD-726.10) 19)  DEGENERATIVE DISC DISEASE, CERVICAL SPINE, W/RADICULOPATHY (ICD-722.0)   Thanks for your input.  Hope all is  well with you.  Sincerely,  Julieanne Manson MD

## 2010-05-26 NOTE — Medication Information (Signed)
Summary: RX Folder//SYMBICORT//APPROVED  RX Folder//SYMBICORT//APPROVED   Imported By: Arta Bruce 11/03/2009 14:36:05  _____________________________________________________________________  External Attachment:    Type:   Image     Comment:   External Document

## 2010-05-26 NOTE — Progress Notes (Signed)
Summary: Medication Request   Phone Note Call from Patient   Summary of Call: The pt called [place] back again because the [facility] is closing down so he would like to know if the viagra prescription can be call in to [facility]) [phone]. Mulberry MD Initial call taken by: Manon Hilding,  December 09, 2009 10:09 AM  Follow-up for Phone Call        Rx transferred as requested.  Follow-up by: Dutch Quint RN,  December 09, 2009 10:18 AM

## 2010-05-26 NOTE — Progress Notes (Signed)
Summary: /DEPRESSION SCREENING  /DEPRESSION SCREENING   Imported By: Arta Bruce 05/02/2010 10:38:30  _____________________________________________________________________  External Attachment:    Type:   Image     Comment:   External Document

## 2010-05-26 NOTE — Letter (Signed)
Summary: *HSN Results Follow up  [facility]  [address] [place], [place] [address]   Phone: [phone]  Fax: [phone]      05/03/2010   OSAMAH SCHMADER [facility] [address] [place], [place]  [address]   Dear  Mr. Arath HORNADAY,                        Comments: I been trying to reach you by phone about your appt that you have Brooke Dare  Nurse 05-09-10 @ 1pm and with the Dr 05-20-10 @ 2:30pm . Ph # [phone] Address [address]  Thank you.       _________________________________________________________ If you have any questions, please contact our office                     Sincerely,  Cheryll Dessert [facility]

## 2010-05-26 NOTE — Progress Notes (Signed)
Summary: Query:  Refill hydrocortisone cream?  Phone Note Call from Patient   Summary of Call: Refill request for Hydrocortisone cream -- Pt. states that his rash in the wintertime is starting up.  Rash is on his forehead, near his eyebrows, gets worse as the weather gets colder.  Do you want to refill this? Initial call taken by: Dutch Quint RN,  January 25, 2010 3:53 PM  Follow-up for Phone Call        Let him know it has been sent to [facility] [address] Follow-up by: Julieanne Manson MD,  January 31, 2010 10:19 AM  Additional Follow-up for Phone Call Additional follow up Details #1::        Pt. notified of Rx.  Dutch Quint RN  February 01, 2010 11:21 AM     New/Updated Medications: HYDROCORTISONE 2.5 % CREA (HYDROCORTISONE) apply two times a day to affected areas as needed. Prescriptions: HYDROCORTISONE 2.5 % CREA (HYDROCORTISONE) apply two times a day to affected areas as needed.  #60 g x 1   Entered and Authorized by:   Julieanne Manson MD   Signed by:   Julieanne Manson MD on 01/31/2010   Method used:   Electronically to        [facility] [address] [phone]* (retail)       [address] [place], [place]  [address]       Ph: [phone]       Fax: [phone]   RxID:   [id number]

## 2010-05-26 NOTE — Assessment & Plan Note (Signed)
Summary: RENEW MEDS/BLOOD WORK///KT   Vital Signs:  Patient profile:   55 year old male Weight:      147 pounds Temp:     98.0 degrees F Pulse rate:   82 / minute Pulse rhythm:   regular Resp:     18 per minute BP sitting:   105 / 68  (left arm) Cuff size:   regular  Vitals Entered By: Vesta Mixer CMA (October 26, 2009 2:07 PM) CC: f/u and needs refill on inhaler and neurontin Is Patient Diabetic? No Pain Assessment Patient in pain? no       Does patient need assistance? Ambulation Normal   CC:  f/u and needs refill on inhaler and neurontin.  History of Present Illness: 1.  Chronic neck pain:  seeing Physician out of [facility] through Dr. Betti Cruz  [phone] that has placed him on Subroxine.  Dr. Robley Fries.  [facility].  Has come off other pain meds.  Needs Neurontin refilled.  2.  CAD:  having some pain in left arm as he did with previous MI/angina.  Planning another Cardiolite stress 11/08/09.  3.  Tobacco Use:  Still at 1/2 ppd  4.  COPD:  Has failed Chantix, Wellbutrin.  Thinking of calling governmental help line and also getting patches.  Habits & Providers  Alcohol-Tobacco-Diet     Cigarette Packs/Day: 0.5  Current Medications (verified): 1)  Symbicort 160-4.5 Mcg/act  Aero (Budesonide-Formoterol Fumarate) .Marland Kitchen.. 1 Inhalation Two Times A Day For Breathing 2)  Neurontin 600 Mg  Tabs (Gabapentin) .Marland Kitchen.. 1 Tab By Mouth Three Times A Day 3)  Amlodipine Besylate 5 Mg Tabs (Amlodipine Besylate) .Marland Kitchen.. 1 By Mouth Once Daily 4)  Adult Aspirin Low Strength 81 Mg Tbdp (Aspirin) .Marland Kitchen.. 1 Tab By Mouth Daily 5)  Lopressor 50 Mg Tabs (Metoprolol Tartrate) .... 1/2 Two Times A Day 6)  Altace 5 Mg Tabs (Ramipril) .Marland Kitchen.. 1 By Mouth Once Daily 7)  Plavix 75 Mg Tabs (Clopidogrel Bisulfate) .... One Pill Daily 8)  Crestor 40 Mg Tabs (Rosuvastatin Calcium) .Marland Kitchen.. 1 Tab By Mouth Daily 9)  Lisinopril 10 Mg Tabs (Lisinopril) .Marland Kitchen.. 1 By Mouth Once Daily 10)   Proventil Hfa 108 (90 Base) Mcg/act Aers (Albuterol Sulfate) .... 2 Puffs Every 4 Hours As Needed For Wheezing 11)  Zoloft 50 Mg Tabs (Sertraline Hcl) .... 1/2 Tab By Mouth Daily For 7 Days, Then Increase To 1 Tab By Mouth Daily 12)  Suboxone 8-2 Mg Subl (Buprenorphine Hcl-Naloxone Hcl) .Marland Kitchen.. 1 By Mouth Two Times A Day  Allergies (verified): 1)  Nsaids  Social History: Packs/Day:  0.5  Physical Exam  General:  NAD Lungs:  Normal respiratory effort, chest expands symmetrically. Lungs are clear to auscultation, no crackles or wheezes. Heart:  Normal rate and regular rhythm. S1 and S2 normal without gallop, murmur, click, rub or other extra sounds.  Radial pulses normal and equal Extremities:  No edema   Impression & Recommendations:  Problem # 1:  ERECTILE DYSFUNCTION, ORGANIC (ICD-607.84) Pt. to wait on Viagra until has cardiac clearance with Dr. Mayford Knife  Problem # 2:  CAD (ICD-414.00) As above The following medications were removed from the medication list:    Lisinopril 10 Mg Tabs (Lisinopril) .Marland Kitchen... 1 by mouth once daily His updated medication list for this problem includes:    Amlodipine Besylate 5 Mg Tabs (Amlodipine besylate) .Marland Kitchen... 1 by mouth once daily    Adult Aspirin Low Strength 81 Mg Tbdp (Aspirin) .Marland KitchenMarland KitchenMarland KitchenMarland Kitchen 1  tab by mouth daily    Lopressor 50 Mg Tabs (Metoprolol tartrate) .Marland Kitchen... 1/2 two times a day    Altace 5 Mg Tabs (Ramipril) .Marland Kitchen... 1 by mouth once daily    Plavix 75 Mg Tabs (Clopidogrel bisulfate) ..... One pill daily  Problem # 3:  ESSENTIAL HYPERTENSION (ICD-401.9) Controlled The following medications were removed from the medication list:    Lisinopril 10 Mg Tabs (Lisinopril) .Marland Kitchen... 1 by mouth once daily His updated medication list for this problem includes:    Amlodipine Besylate 5 Mg Tabs (Amlodipine besylate) .Marland Kitchen... 1 by mouth once daily    Lopressor 50 Mg Tabs (Metoprolol tartrate) .Marland Kitchen... 1/2 two times a day    Altace 5 Mg Tabs (Ramipril) .Marland Kitchen... 1 by mouth once  daily  Problem # 4:  DEGENERATIVE DISC DISEASE, CERVICAL SPINE, W/RADICULOPATHY (ICD-722.0) Chronic pain--as per substance abuse program  Complete Medication List: 1)  Symbicort 160-4.5 Mcg/act Aero (Budesonide-formoterol fumarate) .Marland Kitchen.. 1 inhalation two times a day for breathing 2)  Neurontin 600 Mg Tabs (Gabapentin) .Marland Kitchen.. 1 tab by mouth three times a day 3)  Amlodipine Besylate 5 Mg Tabs (Amlodipine besylate) .Marland Kitchen.. 1 by mouth once daily 4)  Adult Aspirin Low Strength 81 Mg Tbdp (Aspirin) .Marland Kitchen.. 1 tab by mouth daily 5)  Lopressor 50 Mg Tabs (Metoprolol tartrate) .... 1/2 two times a day 6)  Altace 5 Mg Tabs (Ramipril) .Marland Kitchen.. 1 by mouth once daily 7)  Plavix 75 Mg Tabs (Clopidogrel bisulfate) .... One pill daily 8)  Crestor 40 Mg Tabs (Rosuvastatin calcium) .Marland Kitchen.. 1 tab by mouth daily 9)  Proventil Hfa 108 (90 Base) Mcg/act Aers (Albuterol sulfate) .... 2 puffs every 4 hours as needed for wheezing 10)  Suboxone 8-2 Mg Subl (Buprenorphine hcl-naloxone hcl) .Marland Kitchen.. 1 by mouth two times a day  Patient Instructions: 1)  Follow up with Dr. Delrae Alfred in 4 months-6 months--CPE Prescriptions: NEURONTIN 600 MG  TABS (GABAPENTIN) 1 tab by mouth three times a day  #90 x 11   Entered and Authorized by:   Julieanne Manson MD   Signed by:   Julieanne Manson MD on 10/26/2009   Method used:   Printed then faxed to ...       [facility] (retail)       [address] [place], [place]  [address]       Ph: [phone] or [phone]       Fax: [phone]   RxID:   [id number] PROVENTIL HFA 108 (90 BASE) MCG/ACT AERS (ALBUTEROL SULFATE) 2 puffs every 4 hours as needed for wheezing  #1 x 4   Entered and Authorized by:   Julieanne Manson MD   Signed by:   Julieanne Manson MD on 10/26/2009   Method used:   Electronically to        [facility]* (retail)       [address] [place], [place]  [address]       Ph: [phone] or [phone]       Fax: [phone]   RxID:    [id number] SYMBICORT 160-4.5 MCG/ACT  AERO (BUDESONIDE-FORMOTEROL FUMARATE) 1 inhalation two times a day for breathing  #1 x 11   Entered and Authorized by:   Julieanne Manson MD   Signed by:   Julieanne Manson MD on 10/26/2009   Method used:   Electronically to        [facility]* (retail)  [address]       [place], [place]  [address]       Ph: [phone] or [phone]       Fax: [phone]   RxID:   [id number]

## 2010-05-26 NOTE — Assessment & Plan Note (Signed)
Summary: 4 MONTH FU ON INSOMNIA/OTHERS//JM   Vital Signs:  Patient profile:   55 year old male Weight:      146.19 pounds Temp:     97.9 degrees F Pulse rate:   84 / minute Pulse rhythm:   regular Resp:     16 per minute BP sitting:   160 / 76  (right arm) Cuff size:   regular  Vitals Entered By: Chauncy Passy, SMA CC: Pt. is here for a 4 month f/u. Pt. would like his pain meds refilled from here and went to his psyschitirst Is Patient Diabetic? No Pain Assessment Patient in pain? yes     Location: neck/hip Intensity: 8  Does patient need assistance? Ambulation Normal   CC:  Pt. is here for a 4 month f/u. Pt. would like his pain meds refilled from here and went to his psyschitirst.  History of Present Illness: 1.  Chronic neck and shoulder pain:  was following with Dr. Lovell Sheehan, but missed appt. in July secondary to being just discharged from the hospital and then missed another in [place] states he did not even realize he missed that one.  Pt. continued to receive pain meds until March--papers from pharmacy given to me to support.  Dr. Lovell Sheehan office has cut off pain meds subsequently.  Pt. went to Dr. Betti Cruz with [facility]  [phone] 4 days ago.  Planning to start Sobaxin??? to wean pt. from the Vicodin.  Would like [place] to fill his pain meds for one month.  Pt. takes anywhere from 3-5 Vicodin daily.  Pt. now with Medicaid and makes it easier for him to see Psych and get help with the pain med usage.    2.  Hypertension:  BP up a bit, but in pain.   Allergies (verified): 1)  Nsaids  Physical Exam  General:  anxious Lungs:  Normal respiratory effort, chest expands symmetrically. Lungs are clear to auscultation, no crackles or wheezes. Heart:  Normal rate and regular rhythm. S1 and S2 normal without gallop, murmur, click, rub or other extra sounds.  Radial pulses normal and equal Msk:  Tender over left trap to shoulder.  NT over cervical spinous  processes   Impression & Recommendations:  Problem # 1:  DEGENERATIVE DISC DISEASE, CERVICAL SPINE, W/RADICULOPATHY (ICD-722.0) One time month fill of Vicodin Pt. aware and plans to wean off pain meds.  Problem # 2:  ESSENTIAL HYPERTENSION (ICD-401.9) Up a bit today--probably secondary to  pain His updated medication list for this problem includes:    Amlodipine Besylate 5 Mg Tabs (Amlodipine besylate) .Marland Kitchen... 1 by mouth once daily    Lopressor 50 Mg Tabs (Metoprolol tartrate) .Marland Kitchen... 1/2 two times a day    Altace 5 Mg Tabs (Ramipril) .Marland Kitchen... 1 by mouth once daily    Lisinopril 10 Mg Tabs (Lisinopril) .Marland Kitchen... 1 by mouth once daily  Complete Medication List: 1)  Symbicort 160-4.5 Mcg/act Aero (Budesonide-formoterol fumarate) .Marland Kitchen.. 1 inhalation two times a day for breathing 2)  Neurontin 600 Mg Tabs (Gabapentin) .Marland Kitchen.. 1 tab by mouth three times a day 3)  Hydrocodone-acetaminophen 10-500 Mg Tabs (Hydrocodone-acetaminophen) .Marland Kitchen.. 1 tab by mouth every 8 hours as needed pain 4)  Amlodipine Besylate 5 Mg Tabs (Amlodipine besylate) .Marland Kitchen.. 1 by mouth once daily 5)  Adult Aspirin Low Strength 81 Mg Tbdp (Aspirin) .Marland Kitchen.. 1 tab by mouth daily 6)  Lopressor 50 Mg Tabs (Metoprolol tartrate) .... 1/2 two times a day 7)  Altace 5 Mg  Tabs (Ramipril) .Marland Kitchen.. 1 by mouth once daily 8)  Plavix 75 Mg Tabs (Clopidogrel bisulfate) .... One pill daily 9)  Crestor 40 Mg Tabs (Rosuvastatin calcium) .Marland Kitchen.. 1 tab by mouth daily 10)  Lisinopril 10 Mg Tabs (Lisinopril) .Marland Kitchen.. 1 by mouth once daily 11)  Proventil Hfa 108 (90 Base) Mcg/act Aers (Albuterol sulfate) .... 2 puffs every 4 hours as needed for wheezing 12)  Zoloft 50 Mg Tabs (Sertraline hcl) .... 1/2 tab by mouth daily for 7 days, then increase to 1 tab by mouth daily  Patient Instructions: 1)  nurse visit for bp check in 2 weeks 2)  Follow up with Dr. Delrae Alfred in 6 months Prescriptions: HYDROCODONE-ACETAMINOPHEN 10-500 MG  TABS (HYDROCODONE-ACETAMINOPHEN) 1 tab by mouth  every 8 hours as needed pain  #100 x 0   Entered and Authorized by:   Julieanne Manson MD   Signed by:   Julieanne Manson MD on 07/29/2009   Method used:   Print then Give to Patient   RxID:   [id number]

## 2010-05-26 NOTE — Letter (Signed)
Summary: New Patient letter  [facility]  [address] [place], [place] [address]   Phone: [phone]  Fax: [phone]       05/09/2010 MRN: [id number]  [name] [address] [place], [place]  [address]  Dear Mr. HORNADAY,  Welcome to the Gastroenterology Division at [facility].    You are scheduled to see Dr.  Jarold Motto on 05-26-10 at 8:30a.m. on the 3rd floor at [facility], [address]  We ask that you try to arrive at our office 15 minutes prior to your appointment time to allow for check-in.  We would like you to complete the enclosed self-administered evaluation form prior to your visit and bring it with you on the day of your appointment.  We will review it with you.  Also, please bring a complete list of all your medications or, if you prefer, bring the medication bottles and we will list them.  Please bring your insurance card so that we may make a copy of it.  If your insurance requires a referral to see a specialist, please bring your referral form from your primary care physician.  Co-payments are due at the time of your visit and may be paid by cash, check or credit card.     Your office visit will consist of a consult with your physician (includes a physical exam), any laboratory testing he/she may order, scheduling of any necessary diagnostic testing (e.g. x-ray, ultrasound, CT-scan), and scheduling of a procedure (e.g. Endoscopy, Colonoscopy) if required.  Please allow enough time on your schedule to allow for any/all of these possibilities.    If you cannot keep your appointment, please call [phone] to cancel or reschedule prior to your appointment date.  This allows [place] the opportunity to schedule an appointment for another patient in need of care.  If you do not cancel or reschedule by 5 p.m. the business day prior to your appointment date, you will be charged a $50.00 late cancellation/no-show fee.    Thank you for  choosing [facility] for your medical needs.  We appreciate the opportunity to care for you.  Please visit [place] at our website  to learn more about our practice.                     Sincerely,                                                             The Gastroenterology Division

## 2010-05-26 NOTE — Letter (Signed)
Summary: REFERRAL PHYSICAL THERAPY  REFERRAL PHYSICAL THERAPY   Imported By: Arta Bruce 07/06/2009 12:42:07  _____________________________________________________________________  External Attachment:    Type:   Image     Comment:   External Document

## 2010-05-26 NOTE — Progress Notes (Signed)
   Phone Note Call from Patient Call back at [facility] Phone [phone]   Summary of Call: The pt wants to make sure if is safe to take zoloft, aspirin and plavix because of his blood thinner.  Pt needs the provider answer the question.  Please call him back. Deziyah Arvin MD  Initial call taken by: Manon Hilding,  May 25, 2009 10:49 AM  Follow-up for Phone Call        Will check with provider. Follow-up by: Vesta Mixer CMA,  May 25, 2009 11:54 AM  Additional Follow-up for Phone Call Additional follow up Details #1::        Those are fine to take together Additional Follow-up by: Julieanne Manson MD,  May 25, 2009 6:21 PM    Additional Follow-up for Phone Call Additional follow up Details #2::    Left message on answering machine for pt to return call at [phone]. Follow-up by: Vesta Mixer CMA,  May 27, 2009 12:14 PM  Additional Follow-up for Phone Call Additional follow up Details #3:: Details for Additional Follow-up Action Taken: pt aware. Additional Follow-up by: Vesta Mixer CMA,  May 27, 2009 12:38 PM

## 2010-05-26 NOTE — Assessment & Plan Note (Signed)
Summary: NOT SLEEPING/ANXIETY/STOP SMOKING//KT   Vital Signs:  Patient profile:   55 year old male Weight:      149.7 pounds BMI:     24.25 Temp:     97.0 degrees F Pulse rate:   72 / minute Pulse rhythm:   regular Resp:     16 per minute BP sitting:   128 / 80  (left arm) Cuff size:   regular  Vitals Entered By: Vesta Mixer CMA (May 18, 2009 11:47 AM) CC: anxiety issues, sent here by his cardiologist was trying to quit smoking, but is instead smoking more.  Per pt stopped taking ambien did not work and also stopped wellbutrin made him have crazy thoughts. Is Patient Diabetic? No Pain Assessment Patient in pain? yes     Location: right hip Intensity: 6  Does patient need assistance? Ambulation Normal   CC:  anxiety issues, sent here by his cardiologist was trying to quit smoking, and but is instead smoking more.  Per pt stopped taking ambien did not work and also stopped wellbutrin made him have crazy thoughts..  History of Present Illness: 1.  CAD:  had cardiolite mid November--reportedly was fine--no new concerns.  Went to ED before Christmas with left arm similar to when had MI--ruled out MI.  Dr. Mayford Knife started him on Zyban subsequently, but did not tolerated--odd thoughts, dizzy, couldn't function.  Contiues to smoke--now back up to 1/2 ppd.  Chantix did not seem to help and nerves were not good.  Has never tried patches.  2.  Insomnia:  Cannot settle down at night.  Mind does not stop.  Tries to have the same routine. Still drinking Mountain Dew--16 oz daily--diet, not regular.  1 cup of coffee --all caffeine is before lunch.  Zolpidem--not allowing for full night's sleep.  3.  Right greater trochanteric bursitis--never heard from PT.  Also having muscle cramps in legs at night.  Does eat oranges.  Not much in way of calcium containing food, beverages.  Bar of soap has not helped.    4.  Erectile dysfunction:  Did not bring this up with Dr. Mayford Knife.  Pt. is fairly  physically active without chest pain at this time.  Never completed cardiac rehab as could not leave mother alone.  5.  Anxiety:  Unable to relax.  When attempts to stop smoking, worse.  Unable to relax.  Tries to physically active throughout the day.  Cannot say anything is stressing him.  Seems to be worsening past 2 years.  Worried about his heart.  Perserverates on health and cannot get thoughts to stop.  Has taken Xanax or something similar in past.  Has also been on Trazodone for sleep.  No history of panic attacks.  Current Medications (verified): 1)  Symbicort 160-4.5 Mcg/act  Aero (Budesonide-Formoterol Fumarate) .Marland Kitchen.. 1 Inhalation Two Times A Day For Breathing 2)  Neurontin 600 Mg  Tabs (Gabapentin) .Marland Kitchen.. 1 Tab By Mouth Three Times A Day 3)  Hydrocodone-Acetaminophen 10-500 Mg  Tabs (Hydrocodone-Acetaminophen) .Marland Kitchen.. 1 Q 8hours As Needed Pain 4)  Amlodipine Besylate 5 Mg Tabs (Amlodipine Besylate) .Marland Kitchen.. 1 By Mouth Once Daily 5)  Adult Aspirin Low Strength 81 Mg Tbdp (Aspirin) .Marland Kitchen.. 1 Tab By Mouth Daily 6)  Lopressor 50 Mg Tabs (Metoprolol Tartrate) .... 1/2 Two Times A Day 7)  Altace 5 Mg Tabs (Ramipril) .Marland Kitchen.. 1 By Mouth Once Daily 8)  Plavix 75 Mg Tabs (Clopidogrel Bisulfate) .... One Pill Daily 9)  Crestor 40 Mg Tabs (  Rosuvastatin Calcium) .Marland Kitchen.. 1 Tab By Mouth Daily 10)  Lisinopril 10 Mg Tabs (Lisinopril) .Marland Kitchen.. 1 By Mouth Once Daily 11)  Zolpidem Tartrate 5 Mg Tabs (Zolpidem Tartrate) .Marland Kitchen.. 1-2 Tabs By Mouth Q Hs As Needed Sleep  Allergies (verified): 1)  Nsaids  Physical Exam  Lungs:  Dry crackles--right posterior base--similar finding with normal CXR in past Heart:  Normal rate and regular rhythm. S1 and S2 normal without gallop, murmur, click, rub or other extra sounds.  Radial pulses normal and equal   Impression & Recommendations:  Problem # 1:  ANXIETY (ICD-300.00)  with insomnia Start Zoloft  His updated medication list for this problem includes:    Zoloft 50 Mg Tabs  (Sertraline hcl) .Marland Kitchen... 1/2 tab by mouth daily for 7 days, then increase to 1 tab by mouth daily  Orders: Psychology Referral (Psychology)  Problem # 2:  ERECTILE DYSFUNCTION, ORGANIC (ICD-607.84) Will check with Dr. Mayford Knife regarding trying Viagra with his CAD--sounds like he is stable and not taking nitrates  Problem # 3:  CRACKLES RIGHT LOWER LUNG FIELD (ICD-793.1) stable  Problem # 4:  RIGHT GREATER TROCHANTERIC BURSITIS (ICD-726.5) and leg cramps--encouraged more low fat milk, water intake.   Regular stretching of legs Physical therapy referral again  Complete Medication List: 1)  Symbicort 160-4.5 Mcg/act Aero (Budesonide-formoterol fumarate) .Marland Kitchen.. 1 inhalation two times a day for breathing 2)  Neurontin 600 Mg Tabs (Gabapentin) .Marland Kitchen.. 1 tab by mouth three times a day 3)  Hydrocodone-acetaminophen 10-500 Mg Tabs (Hydrocodone-acetaminophen) .Marland Kitchen.. 1 q 8hours as needed pain 4)  Amlodipine Besylate 5 Mg Tabs (Amlodipine besylate) .Marland Kitchen.. 1 by mouth once daily 5)  Adult Aspirin Low Strength 81 Mg Tbdp (Aspirin) .Marland Kitchen.. 1 tab by mouth daily 6)  Lopressor 50 Mg Tabs (Metoprolol tartrate) .... 1/2 two times a day 7)  Altace 5 Mg Tabs (Ramipril) .Marland Kitchen.. 1 by mouth once daily 8)  Plavix 75 Mg Tabs (Clopidogrel bisulfate) .... One pill daily 9)  Crestor 40 Mg Tabs (Rosuvastatin calcium) .Marland Kitchen.. 1 tab by mouth daily 10)  Lisinopril 10 Mg Tabs (Lisinopril) .Marland Kitchen.. 1 by mouth once daily 11)  Proventil Hfa 108 (90 Base) Mcg/act Aers (Albuterol sulfate) .... 2 puffs every 4 hours as needed for wheezing 12)  Zoloft 50 Mg Tabs (Sertraline hcl) .... 1/2 tab by mouth daily for 7 days, then increase to 1 tab by mouth daily  Other Orders: Physical Therapy Referral (PT)  Patient Instructions: 1)  Referral to [facility] for counseling for anxiety 2)  Follow up with Dr. Delrae Alfred in 1 month --anxiety and smoking cessation 3)  Nicotine patches--take higher dose--14-21 mg daily for 1 month, then down to 14 or 7 for at  least 2 weeks, then stop or go down to 7 mg for 2 weeks, then stop 4)  Call if PT does not call in next 2 weeks. Prescriptions: ZOLOFT 50 MG TABS (SERTRALINE HCL) 1/2 tab by mouth daily for 7 days, then increase to 1 tab by mouth daily  #30 x 2   Entered and Authorized by:   Julieanne Manson MD   Signed by:   Julieanne Manson MD on 05/18/2009   Method used:   Faxed to ...       [facility] (retail)       [address] [place], [place]  [address]       Ph: [phone]       Fax: [phone]  RxID:   [id number] PROVENTIL HFA 108 (90 BASE) MCG/ACT AERS (ALBUTEROL SULFATE) 2 puffs every 4 hours as needed for wheezing  #1 x 1   Entered and Authorized by:   Julieanne Manson MD   Signed by:   Julieanne Manson MD on 05/18/2009   Method used:   Faxed to ...       [facility] (retail)       [address] [place], [place]  [address]       Ph: [phone]       Fax: [phone]   RxID:   [id number]

## 2010-05-26 NOTE — Miscellaneous (Signed)
Summary: [facility] PV- pt on PLavix  Clinical Lists Changes   pt arrived for PV for screening colonoscopy.  Pt on Plavix.  Colonoscopy cancelled; new patient appointment scheduled w/ Dr. Jarold Motto.

## 2010-05-26 NOTE — Miscellaneous (Signed)
Summary: Approval- Symbicort 160-4.5 x 2 years -- 10/27/09 - 10/27/11   Clinical Lists Changes  Approval- Symbicort 160-4.5 micrograms inhaler x 2 years -- 10/27/09 - 10/27/11  Appended Document: Approval- Symbicort 160-4.5 x 2 years -- 10/27/09 - 10/27/11

## 2010-05-26 NOTE — Progress Notes (Signed)
Summary: NEEDS PAIN MEDS   Phone Note Call from Patient   Reason for Call: Refill Medication Summary of Call: [name] PT. [name] CALLED AND SAYS THAT HIS NEUROSURGEON DR [name] IS NO LONGER GOING TO RENEW HIS PAIN MEDICATION (HYDROCODONE) AS OF TOMORROW UNTIL HE SEES HIM AGAIN, WHICH WILL BE IN JULY, SO HE WAS TOLD TO CALL HERE TO GET HIS PRIMARY DR TO PRESCRIBE THEM. THEY TOLD HIM THAT ITS URGENT, BECAUSE HE HAS BEEN ON THEM FOR 2 1/2 YEARS AND IF HE DOESN'T GET REFILLS HE MAY HAVE SEIZURES AND HE HAS A HISTORY OF SEIZURES.  HE USES [facility] Initial call taken by: [name],  July 19, 2009 11:29 AM  Follow-up for Phone Call        Pt agree to change his appointment to next week but he needs refills from his hydrocodone medicaiton becaus he is out. [name]  July 20, 2009 9:47 AM    Additional Follow-up for Phone Call Additional follow up Details #1::        Pt's girlfriend states Dr. Lovell Matthews office stopped his vicodin because they have not seen him in over a year and the next appt they offered him is in July and for him to call his pcp to refill his pain med.  Pt was scheduled to see Dr. Frazier Matthews yesterday afternoon, but had to reschedule due to Dr. Delrae Matthews being out sick.  He last took it yesterday and is afraid he will have bad withdraw if he does not get a refill soon.  [facility]. Additional Follow-up by: [name],  July 21, 2009 4:54 PM    Additional Follow-up for Phone Call Additional follow up Details #2::    Defer to PCP. Follow-up by: [name],  July 21, 2009 5:04 PM  Additional Follow-up for Phone Call Additional follow up Details #3:: Details for Additional Follow-up Action Taken: Called to Dr. Lovell Matthews office and it is correct that Dr. Lovell Matthews will not prescribe pain meds until pt is seen, however they do not have a f/u appt scheduled.  He has had two no shows........... [name]  July 22, 2009 2:51 PM     SPOKE W/[name] GIRLFRIEND AND TOLD HER THAT DR. Lovell Matthews OFFICE SAYS THAT HE WILL NEED TO CALL & SCHEDULE ANOTHER APPT. TO SEE [name]. I ALSO MENTION THAT DR [name] CAN'T DO ANYTHING UNTIL SHE REVIEWS WHAT DR [name] HAS SAID.Marland KitchenCala Bradford Matthews  July 22, 2009 3:33 PM  [name] called again and states that Dr [name] refused to prescribed the medication because he missed his appointment on September and according to [name] the provider needs to either dropped form the med or something else. [phone]  .Marland KitchenManon Matthews  July 23, 2009 2:24 PM  I would like his recent records from Dr. Lovell Matthews' office regarding last ov and any info regarding the pain meds. We have not prescribed in past and he apparently did not show appropriately to continue receiving the pain meds.  This was written on Monday, April 4th--did not sign previously.  [name]  July 28, 2009 10:14 PM   [name], I do not understand your note above--please clarify.  [name]  July 28, 2009 10:14 PM   left msg with med. records to send records....... [name]  July 29, 2009 10:07 AM

## 2010-05-26 NOTE — Letter (Signed)
Summary: Pre Visit Letter Revised  [facility]  [address] [place], [place] [address]   Phone: [phone]  Fax: [phone]        05/02/2010 MRN: [id number] [name] [address] [place], [place]  [address]             Procedure Date:  05/20/2010 @ 2:30   Direct colon0-Dr. Jarold Motto   Welcome to the Gastroenterology Division at [facility].    You are scheduled to see a nurse for your pre-procedure visit on 05/09/2010 at 1:00 on the 3rd floor at [facility], [address]  We ask that you try to arrive at our office 15 minutes prior to your appointment time to allow for check-in.  Please take a minute to review the attached form.  If you answer "Yes" to one or more of the questions on the first page, we ask that you call the person listed at your earliest opportunity.  If you answer "No" to all of the questions, please complete the rest of the form and bring it to your appointment.    Your nurse visit will consist of discussing your medical and surgical history, your immediate family medical history, and your medications.   If you are unable to list all of your medications on the form, please bring the medication bottles to your appointment and we will list them.  We will need to be aware of both prescribed and over the counter drugs.  We will need to know exact dosage information as well.    Please be prepared to read and sign documents such as consent forms, a financial agreement, and acknowledgement forms.  If necessary, and with your consent, a friend or relative is welcome to sit-in on the nurse visit with you.  Please bring your insurance card so that we may make a copy of it.  If your insurance requires a referral to see a specialist, please bring your referral form from your primary care physician.  No co-pay is required for this nurse visit.     If you cannot keep your appointment, please call [phone] to cancel or reschedule prior  to your appointment date.  This allows [place] the opportunity to schedule an appointment for another patient in need of care.    Thank you for choosing [facility] for your medical needs.  We appreciate the opportunity to care for you.  Please visit [place] at our website  to learn more about our practice.  Sincerely, The Gastroenterology Division

## 2010-05-26 NOTE — Letter (Signed)
Summary: Generic Letter  [facility]  [address] [place], [place] [address]   Phone: [phone]  Fax: [phone]    04/29/2010  Re:  KALED ALLENDE      [address]      [place], [place]  [address]  Dear Lambert Mody:  [name] has tested positive for a low testosterone recently. He does have symptoms that may be related, but are also likely multifactorial.  After discussion, we have elected to hold on replacement until he can stop smoking for 3 months.  Am concerned to start with his CAD, cholesterol, and PVD history.  We will be following his cholesterol, CBC, PSA frequently should replacement be startyed.  Just wanted you to be aware.        Sincerely,   Julieanne Manson MD

## 2010-05-26 NOTE — Miscellaneous (Signed)
   Clinical Lists Changes  Problems: Added new problem of COPD (ICD-496)

## 2010-06-04 ENCOUNTER — Encounter (INDEPENDENT_AMBULATORY_CARE_PROVIDER_SITE_OTHER): Payer: Self-pay | Admitting: Internal Medicine

## 2010-06-09 NOTE — Assessment & Plan Note (Signed)
Summary: FU FOR CPE////KT   Vital Signs:  Patient profile:   55 year old male Weight:      152.25 pounds Temp:     96.9 degrees F oral Pulse rate:   80 / minute Pulse rhythm:   regular Resp:     14 per minute BP sitting:   126 / 72  (left arm) Cuff size:   regular  Vitals Entered By: Hale Drone CMA (April 29, 2010 9:32 AM) CC: 55 y/o CP.  Is Patient Diabetic? No Pain Assessment Patient in pain? no       Does patient need assistance? Functional Status Self care Ambulation Normal  Vision Screening:Left eye w/o correction: 20 / 20-1 Right Eye w/o correction: 20 / 20 Both eyes w/o correction:  20/ 20        Vision Entered By: Hale Drone CMA (April 29, 2010 9:39 AM)   CC:  55 y/o CP. Marland Kitchen  History of Present Illness: 55 yo male here for CPE.  Concerns:  1.  Low Testosterone.  Pt. with ED, fatigue, depression.  Discussed risk factors with taking testosterone supplements--prostate CA, increased risk of clotting, polycythemia, increased cholesterol, particularly in face of known CAD.  Current Medications (verified): 1)  Symbicort 160-4.5 Mcg/act  Aero (Budesonide-Formoterol Fumarate) .Marland Kitchen.. 1 Inhalation Two Times A Day For Breathing 2)  Neurontin 600 Mg  Tabs (Gabapentin) .Marland Kitchen.. 1 Tab By Mouth Three Times A Day 3)  Amlodipine Besylate 5 Mg Tabs (Amlodipine Besylate) .Marland Kitchen.. 1 By Mouth Once Daily 4)  Adult Aspirin Low Strength 81 Mg Tbdp (Aspirin) .Marland Kitchen.. 1 Tab By Mouth Daily 5)  Lopressor 50 Mg Tabs (Metoprolol Tartrate) .... 1/2 Two Times A Day 6)  Altace 5 Mg Tabs (Ramipril) .Marland Kitchen.. 1 By Mouth Once Daily 7)  Plavix 75 Mg Tabs (Clopidogrel Bisulfate) .... One Pill Daily 8)  Crestor 40 Mg Tabs (Rosuvastatin Calcium) .Marland Kitchen.. 1 Tab By Mouth Daily 9)  Proventil Hfa 108 (90 Base) Mcg/act Aers (Albuterol Sulfate) .... 2 Puffs Every 4 Hours As Needed For Wheezing 10)  Suboxone 8-2 Mg Subl (Buprenorphine Hcl-Naloxone Hcl) .Marland Kitchen.. 1 By Mouth Two Times A Day 11)  Viagra 50 Mg Tabs (Sildenafil  Citrate) .Marland Kitchen.. 1-2 Tabs By Mouth 1 Hour Prior To Activity.  Do Not Exceed 2 Tabs in 24 Hours 12)  Cetirizine Hcl 10 Mg Tabs (Cetirizine Hcl) .Marland Kitchen.. 1 Tab By Mouth Daily As Needed Congestion and Cough 13)  Hydrocortisone 2.5 % Crea (Hydrocortisone) .... Apply Two Times A Day To Affected Areas As Needed.  Allergies (verified): 1)  Nsaids  Past History:  Past Medical History: Reviewed history from 06/26/2008 and no changes required. HYPERCHOLESTEROLEMIA (ICD-272.0) CRACKLES RIGHT LOWER LUNG FIELD (ICD-793.1) CARPAL TUNNEL SYNDROME, RIGHT (ICD-354.0) RIGHT GREATER TROCHANTERIC BURSITIS (ICD-726.5) ESSENTIAL HYPERTENSION (ICD-401.9) PLANTAR FASCIITIS, LEFT (ICD-728.71) PAIN IN JOINT PELVIC REGION AND THIGH (ICD-719.45) DEPRESSION (ICD-311) SKIN RASH (ICD-782.1) REACTIVE AIRWAY DISEASE (ICD-493.90) ABSCESS, TOOTH (ICD-522.5) TOBACCO ABUSE (ICD-305.1) LIVER FUNCTION TESTS, ABNORMAL, HX OF (ICD-V12.2) INSOMNIA (ICD-780.52) ALCOHOL ABUSE, HX OF (ICD-V11.3) ANXIETY (ICD-300.00) LUNG NODULES (ICD-518.89) ROTATOR CUFF SYNDROME, LEFT (ICD-726.10) DEGENERATIVE DISC DISEASE, CERVICAL SPINE, W/RADICULOPATHY (ICD-722.0)  Past Surgical History: 1.  02/19/08: C4-5 and C6-7 anterior cervical discectomy, fusion, and plating  Dr. Lovell Sheehan 2.  09/2008:  2 Stents placed:  Dr. Mayford Knife  Family History: Mother, 33:  DM, lumbar scoliosis. Father, died 71:  MI 2 Brothers:  one with low back problems, hypertension.  Other healthy Sister, died at 5 months--crib death. 2 children, 50  and 33:  healthy  Social History: Lives with mother and girlfriend. On SSI disability:  CAD, depression, leg pain Previously worked as a Psychologist, occupational, Insurance claims handler work, Magazine features editor. Divorced, but living with current long term girlfriend. Daughter in prison. Son in [place] Current Smoker:  Started age 51 yo.  Down to 1/2 ppd now. Alcohol:  Stopped 2007 Drug Use:  Hx of pain pill addiction-off since around July of  2001 since on Suboxane.  Review of Systems General:  Energy up and down. Difficulty getting started in the morning.. Eyes:  Denies blurring. ENT:  Denies decreased hearing. CV:  Intermittent pain in arm, though not same quality of pain as with angina Last stress testing was 4-5 months ago and was okay with Dr. Mayford Knife (done secondary to problems). Resp:  Denies shortness of breath. GI:  Complains of constipation; denies abdominal pain, bloody stools, dark tarry stools, and diarrhea. GU:  Denies dysuria and urinary frequency; Still problems with erection--somewhat of and erection, but not adequate. MS:  Denies joint pain, joint redness, and joint swelling. Derm:  Denies lesion(s) and rash. Neuro:  Denies numbness, tingling, and weakness. Psych:  Denies suicidal thoughts/plans and thoughts of violence; PHQ 9 scored 10.  Pt. states did not tolerate 60 mg of Cymbalta and feels he is much better and stable with depression.  Physical Exam  General:  Well-developed,well-nourished,in no acute distress; alert,appropriate and cooperative throughout examination Head:  Normocephalic and atraumatic without obvious abnormalities. No apparent alopecia or balding. Eyes:  No corneal or conjunctival inflammation noted. EOMI. Perrla. Funduscopic exam benign, without hemorrhages, exudates or papilledema. Vision grossly normal. Ears:  External ear exam shows no significant lesions or deformities.  Nonobstructive cerumen in canals bilaterally.  Otoscopic examination reveals clear canals, tympanic membranes are intact bilaterally without bulging, retraction, inflammation or discharge. Hearing is grossly normal bilaterally. Nose:  External nasal examination shows no deformity or inflammation. Nasal mucosa are pink and moist without lesions or exudates. Mouth:  Oral mucosa and oropharynx without lesions or exudates.  Upper dentures.  No lower dentures in currently. Neck:  No deformities, masses, or tenderness  noted. Lungs:  Normal respiratory effort, chest expands symmetrically. Lungs are clear to auscultation, no crackles or wheezes. Heart:  Normal rate and regular rhythm. S1 and S2 normal without gallop, murmur, click, rub or other extra sounds. Abdomen:  Bowel sounds positive,abdomen soft and non-tender without masses, organomegaly or hernias noted. Rectal:  No external abnormalities noted. Normal sphincter tone. No rectal masses or tenderness.  Heme negative light brown stool Genitalia:  Testes bilaterally descended without nodularity, tenderness or masses. No scrotal masses or lesions. No penis lesions or urethral discharge.uncircumcised.  No hernias Prostate:  Prostate gland firm and smooth, no enlargement, nodularity, tenderness, mass, asymmetry or induration. Msk:  No deformity or scoliosis noted of thoracic or lumbar spine.   Pulses:  R and L carotid,radial,femorall pulses are full and equal bilaterally.  Decreased DP and PT pulses Extremities:  No clubbing, cyanosis, edema, or deformity noted with normal full range of motion of all joints.   Neurologic:  No cranial nerve deficits noted. Station and gait are normal. Plantar reflexes are down-going bilaterally. DTRs are symmetrical throughout. Sensory, motor and coordinative functions appear intact. Skin:  Thickened calloused and cracking skin on distal index fingers  and thumbs bilaterally Cervical Nodes:  No lymphadenopathy noted Axillary Nodes:  No palpable lymphadenopathy Inguinal Nodes:  No significant adenopathy Psych:  Cognition and judgment appear intact. Alert and cooperative with  normal attention span and concentration. No apparent delusions, illusions, hallucinations   Impression & Recommendations:  Problem # 1:  HEALTH MAINTENANCE EXAM (ICD-V70.0)  Guaiac cards X3 to return in 2 weeks Monthly STE  Orders: UA Dipstick w/o Micro (manual) (81191) Gastroenterology Referral (GI)  Problem # 2:  TESTICULAR HYPOFUNCTION  (ICD-257.2) Symptoms include ED, fatigue, depression (all of which are most likely multifactorial in etiology) Discussed with CAD and PVD and still smoking, starting testosterone increases risk for MI, worsening PVD Pt. and girlfriend in agreement that he will stop smoking for 3 months and if able to stop, will consider starting testosterone. Discussed will need to monitor PSA, CBC, Cholesterol frequently after start of testosterone Orders: T-PSA ([address]-56213)  Problem # 3:  ESSENTIAL HYPERTENSION (ICD-401.9) controlled His updated medication list for this problem includes:    Amlodipine Besylate 5 Mg Tabs (Amlodipine besylate) .Marland Kitchen... 1 by mouth once daily    Lopressor 50 Mg Tabs (Metoprolol tartrate) .Marland Kitchen... 1/2 tab  two times a day    Altace 5 Mg Tabs (Ramipril) .Marland Kitchen... 1 by mouth once daily  Orders: UA Dipstick w/o Micro (manual) (08657)  Problem # 4:  DEPRESSION (ICD-311) Pt. and girlfriend feel he is doing well with this despite PHQ 9 score--no interest in change regarding treatment at this time His updated medication list for this problem includes:    Cymbalta 30 Mg Cpep (Duloxetine hcl) .Marland Kitchen... 1 cap by mouth daily.  dr. reddy  Complete Medication List: 1)  Symbicort 160-4.5 Mcg/act Aero (Budesonide-formoterol fumarate) .Marland Kitchen.. 1 inhalation two times a day for breathing 2)  Neurontin 600 Mg Tabs (Gabapentin) .Marland Kitchen.. 1 tab by mouth three times a day 3)  Amlodipine Besylate 5 Mg Tabs (Amlodipine besylate) .Marland Kitchen.. 1 by mouth once daily 4)  Adult Aspirin Low Strength 81 Mg Tbdp (Aspirin) .Marland Kitchen.. 1 tab by mouth daily 5)  Lopressor 50 Mg Tabs (Metoprolol tartrate) .... 1/2 tab  two times a day 6)  Altace 5 Mg Tabs (Ramipril) .Marland Kitchen.. 1 by mouth once daily 7)  Plavix 75 Mg Tabs (Clopidogrel bisulfate) .... One pill daily 8)  Crestor 40 Mg Tabs (Rosuvastatin calcium) .Marland Kitchen.. 1 tab by mouth daily 9)  Proventil Hfa 108 (90 Base) Mcg/act Aers (Albuterol sulfate) .... 2 puffs every 4 hours as needed for  wheezing 10)  Suboxone 8-2 Mg Subl (Buprenorphine hcl-naloxone hcl) .Marland Kitchen.. 1 tab sublingually three times a day.  dr. Myrtie Soman 11)  Viagra 50 Mg Tabs (Sildenafil citrate) .Marland Kitchen.. 1-2 tabs by mouth 1 hour prior to activity.  do not exceed 2 tabs in 24 hours 12)  Cetirizine Hcl 10 Mg Tabs (Cetirizine hcl) .Marland Kitchen.. 1 tab by mouth daily as needed congestion and cough 13)  Hydrocortisone 2.5 % Crea (Hydrocortisone) .... Apply two times a day to affected areas as needed. 14)  Cymbalta 30 Mg Cpep (Duloxetine hcl) .Marland Kitchen.. 1 cap by mouth daily.  dr. reddy 15)  Vitamin D3 1000 Unit Caps (Cholecalciferol) .Marland Kitchen.. 1 cap daily 16)  Vitamin E 1000 Unit Caps (Vitamin e) .Marland Kitchen.. 1 cap by mouth daily  Other Orders: Flu Vaccine 19yrs + (84696) Admin 1st Vaccine (29528)  Patient Instructions: 1)  Follow up with Dr. Delrae Alfred in 3 months for follow up of testosterone Prescriptions: HYDROCORTISONE 2.5 % CREA (HYDROCORTISONE) apply two times a day to affected areas as needed.  #60 g x 1   Entered and Authorized by:   Julieanne Manson MD   Signed by:   Julieanne Manson MD on 04/29/2010   Method  used:   Electronically to        [facility] [place] [phone]* (retail)       [address] [place], [place]  [address]       Ph: [phone]       Fax: [phone]   RxID:   [id number] CETIRIZINE HCL 10 MG TABS (CETIRIZINE HCL) 1 tab by mouth daily as needed congestion and cough  #30 x 6   Entered and Authorized by:   Julieanne Manson MD   Signed by:   Julieanne Manson MD on 04/29/2010   Method used:   Electronically to        [facility] [address] [phone]* (retail)       [address] [place], [place]  [address]       Ph: [phone]       Fax: [phone]   RxID:   [id number] CRESTOR 40 MG TABS (ROSUVASTATIN CALCIUM) 1 tab by mouth daily  #30 x 11   Entered and Authorized by:   Julieanne Manson MD   Signed by:   Julieanne Manson MD on 04/29/2010   Method used:   Electronically to        [facility] [address] [phone]*  (retail)       [address] [place], [place]  [address]       Ph: [phone]       Fax: [phone]   RxID:   [id number] PLAVIX 75 MG TABS (CLOPIDOGREL BISULFATE) one pill daily  #30 x 11   Entered and Authorized by:   Julieanne Manson MD   Signed by:   Julieanne Manson MD on 04/29/2010   Method used:   Electronically to        [facility] [address] [phone]* (retail)       [address] [place], [place]  [address]       Ph: [phone]       Fax: [phone]   RxID:   [id number] ALTACE 5 MG TABS (RAMIPRIL) 1 by mouth once daily  #30 x 11   Entered and Authorized by:   Julieanne Manson MD   Signed by:   Julieanne Manson MD on 04/29/2010   Method used:   Electronically to        [facility] [place] [address] [phone]* (retail)       [address] [place], [place]  [address]       Ph: [phone]       Fax: [phone]   RxID:   [id number] LOPRESSOR 50 MG TABS (METOPROLOL TARTRATE) 1/2 tab  two times a day  #30 x 11   Entered and Authorized by:   Julieanne Manson MD   Signed by:   Julieanne Manson MD on 04/29/2010   Method used:   Electronically to        [facility] [place] [address] [phone]* (retail)       [address] [place], [place]  [address]       Ph: [phone]       Fax: [phone]   RxID:   [id number] ADULT ASPIRIN LOW STRENGTH 81 MG TBDP (ASPIRIN) 1 tab by mouth daily  #30 x 11   Entered and Authorized by:   Julieanne Manson MD   Signed by:  Julieanne Manson MD on 04/29/2010   Method used:   Electronically to        [facility] [phone]* (retail)       [address] [place], [place]  [address]       Ph: [phone]       Fax: [phone]   RxID:   [id number] AMLODIPINE BESYLATE 5 MG TABS (AMLODIPINE BESYLATE) 1 by mouth once daily  #30 x 11   Entered and Authorized by:   Julieanne Manson MD   Signed by:   Julieanne Manson MD on 04/29/2010   Method used:   Electronically to        [facility] [address] [phone]* (retail)       [address] [place], [place]  [address]       Ph: [phone]       Fax: [phone]   RxID:   [id number] NEURONTIN 600 MG  TABS (GABAPENTIN) 1 tab by mouth three times a day  #90 x 11   Entered and Authorized by:   Julieanne Manson MD   Signed by:   Julieanne Manson MD on 04/29/2010   Method used:   Electronically to        [facility] [place] [address] [phone]* (retail)       [address] [place], [place]  [address]       Ph: [phone]       Fax: [phone]   RxID:   [id number] SYMBICORT 160-4.5 MCG/ACT  AERO (BUDESONIDE-FORMOTEROL FUMARATE) 1 inhalation two times a day for breathing  #1 x 11   Entered and Authorized by:   Julieanne Manson MD   Signed by:   Julieanne Manson MD on 04/29/2010   Method used:   Electronically to        [facility] [address] [phone]* (retail)       [address] [place], [place]  [address]       Ph: [phone]       Fax: [phone]   RxID:   [id number]    Orders Added: 1)  Flu Vaccine 55yrs + [23536] 2)  Admin 1st Vaccine [90471] 3)  Est. Patient age 69-64 [61] 4)  UA Dipstick w/o Micro (manual) [81002] 5)  T-PSA [14431-54008] 6)  Gastroenterology Referral [GI]   Immunizations Administered:  Influenza Vaccine # 1:    Vaccine Type: Fluvax 3+    Site: left deltoid    Mfr: GlaxoSmithKline    Dose: 0.5 ml    Route: IM    Given by: Hale Drone CMA    Exp. Date: 10/22/2010    Lot #: [id number]    VIS given: 11/16/09 version given April 29, 2010.  Flu Vaccine Consent Questions:    Do you have a history of severe allergic reactions to this vaccine? no    Any prior history of allergic reactions to egg and/or gelatin? no    Do you have a sensitivity to the preservative Thimersol? no    Do you have a past history of Guillan-Barre Syndrome? no    Do you currently have an acute febrile illness? no    Have you ever had a severe reaction to latex? no    Vaccine information given and explained to patient? yes   Preventive Care  Screening  Prior Values:    PSA:  0.71 (05/22/2008)    Last Tetanus Booster:  Historical (05/24/2006)    Last Flu Shot:  Fluvax 3+ (03/12/2009)    Last Pneumovax:  Pneumovax (06/26/2008)     Guaiac cards:  has performed--not in flow sheet. Colonoscopy:  never STE:  Occasionally--no changes.   Immunizations Administered:  Influenza Vaccine # 1:    Vaccine Type: Fluvax 3+    Site: left deltoid    Mfr: GlaxoSmithKline    Dose: 0.5 ml    Route: IM    Given by: Hale Drone CMA    Exp. Date: 10/22/2010    Lot #: [id number]    VIS given: 11/16/09 version given April 29, 2010.   Laboratory Results   Urine Tests  Date/Time Received: April 29, 2010 11:12 AM   Routine Urinalysis   Color: lt. yellow Appearance: Clear Glucose: negative   (Normal Range: Negative) Bilirubin: negative   (Normal Range: Negative) Ketone: negative   (Normal Range: Negative) Spec. Gravity: <1.005   (Normal Range: 1.003-1.035) Blood: trace-intact   (Normal Range: Negative) pH: 5.0   (Normal Range: 5.0-8.0) Protein: negative   (Normal Range: Negative) Urobilinogen: 0.2   (Normal Range: 0-1) Nitrite: negative   (Normal Range: Negative) Leukocyte Esterace: negative   (Normal Range: Negative)

## 2010-06-09 NOTE — Letter (Signed)
Summary: *HSN Results Follow up  [facility]  [address] [place], [place] [address]   Phone: [phone]  Fax: [phone]      06/04/2010   EVERARD INTERRANTE [facility] [address] [place], [place]  [address]   Dear  Mr. Herny HORNADAY,                            ____S.Drinkard,FNP   ____D. Gore,FNP       ____B. McPherson,MD   ____V. Rankins,MD    _X__E. Rivka Baune,MD    ____N. Daphine Deutscher, FNP  ____D. Reche Dixon, MD    ____K. Philipp Deputy, MD    ____Other     This letter is to inform you that your recent test(s):  _______Pap Smear    ____X___Lab Test     _______X-ray    ___X____ is within acceptable limits  _______ requires a medication change  _______ requires a follow-up lab visit  _______ requires a follow-up visit with your provider   Comments:  PSA  (prostate testing)  was normal       _________________________________________________________ If you have any questions, please contact our office                     Sincerely,  Julieanne Manson MD [facility]

## 2010-06-30 ENCOUNTER — Ambulatory Visit: Payer: Self-pay | Admitting: Gastroenterology

## 2010-07-25 LAB — DIFFERENTIAL
Basophils Absolute: 0 10*3/uL (ref 0.0–0.1)
Basophils Relative: 1 % (ref 0–1)
Eosinophils Absolute: 0.3 10*3/uL (ref 0.0–0.7)
Monocytes Relative: 8 % (ref 3–12)
Neutro Abs: 4.4 10*3/uL (ref 1.7–7.7)
Neutrophils Relative %: 68 % (ref 43–77)

## 2010-07-25 LAB — POCT I-STAT, CHEM 8
BUN: 6 mg/dL (ref 6–23)
Calcium, Ion: 1.22 mmol/L (ref 1.12–1.32)
Creatinine, Ser: 0.5 mg/dL (ref 0.4–1.5)
Glucose, Bld: 105 mg/dL — ABNORMAL HIGH (ref 70–99)
Hemoglobin: 14.3 g/dL (ref 13.0–17.0)
TCO2: 27 mmol/L (ref 0–100)

## 2010-07-25 LAB — PROTIME-INR: Prothrombin Time: 11.9 seconds (ref 11.6–15.2)

## 2010-07-25 LAB — POCT CARDIAC MARKERS
CKMB, poc: 2.3 ng/mL (ref 1.0–8.0)
Myoglobin, poc: 66.8 ng/mL (ref 12–200)

## 2010-07-25 LAB — CBC
MCHC: 34.6 g/dL (ref 30.0–36.0)
MCV: 92.4 fL (ref 78.0–100.0)
Platelets: 221 10*3/uL (ref 150–400)
RBC: 4.35 MIL/uL (ref 4.22–5.81)
WBC: 6.5 10*3/uL (ref 4.0–10.5)

## 2010-07-25 LAB — URINALYSIS, ROUTINE W REFLEX MICROSCOPIC
Bilirubin Urine: NEGATIVE
Ketones, ur: NEGATIVE mg/dL
Nitrite: NEGATIVE
Protein, ur: NEGATIVE mg/dL

## 2010-07-25 LAB — GLUCOSE, CAPILLARY: Glucose-Capillary: 112 mg/dL — ABNORMAL HIGH (ref 70–99)

## 2010-08-01 LAB — APTT: aPTT: 33 seconds (ref 24–37)

## 2010-08-01 LAB — D-DIMER, QUANTITATIVE: D-Dimer, Quant: 0.31 ug/mL-FEU (ref 0.00–0.48)

## 2010-08-01 LAB — BASIC METABOLIC PANEL
BUN: 6 mg/dL (ref 6–23)
Creatinine, Ser: 0.58 mg/dL (ref 0.4–1.5)
GFR calc Af Amer: >60 mL/min (ref >60)
GFR calc non Af Amer: >60 mL/min (ref >60)
Potassium: 3.9 mEq/L (ref 3.5–5.1)

## 2010-08-01 LAB — COMPREHENSIVE METABOLIC PANEL
Albumin: 3.1 g/dL — ABNORMAL LOW (ref 3.5–5.2)
Alkaline Phosphatase: 85 U/L (ref 39–117)
BUN: 4 mg/dL — ABNORMAL LOW (ref 6–23)
Calcium: 9.2 mg/dL (ref 8.4–10.5)
Glucose, Bld: 89 mg/dL (ref 70–99)
Potassium: 3.4 mEq/L — ABNORMAL LOW (ref 3.5–5.1)
Total Protein: 7.2 g/dL (ref 6.0–8.3)

## 2010-08-01 LAB — DIFFERENTIAL
Basophils Relative: 1 % (ref 0–1)
Lymphocytes Relative: 18 % (ref 12–46)
Lymphs Abs: 1.4 10*3/uL (ref 0.7–4.0)
Monocytes Absolute: 0.4 10*3/uL (ref 0.1–1.0)
Monocytes Relative: 6 % (ref 3–12)
Neutro Abs: 5.6 10*3/uL (ref 1.7–7.7)
Neutrophils Relative %: 73 % (ref 43–77)

## 2010-08-01 LAB — CBC
HCT: 36.3 % — ABNORMAL LOW (ref 39.0–52.0)
HCT: 37.3 % — ABNORMAL LOW (ref 39.0–52.0)
HCT: 38.3 % — ABNORMAL LOW (ref 39.0–52.0)
HCT: 39.7 % (ref 39.0–52.0)
Hemoglobin: 12.7 g/dL — ABNORMAL LOW (ref 13.0–17.0)
Hemoglobin: 13.2 g/dL (ref 13.0–17.0)
MCHC: 33.9 g/dL (ref 30.0–36.0)
MCV: 89.2 fL (ref 78.0–100.0)
MCV: 89.2 fL (ref 78.0–100.0)
Platelets: 333 10*3/uL (ref 150–400)
Platelets: 343 10*3/uL (ref 150–400)
Platelets: 352 10*3/uL (ref 150–400)
RBC: 4.29 MIL/uL (ref 4.22–5.81)
RDW: 14.3 % (ref 11.5–15.5)
RDW: 14.7 % (ref 11.5–15.5)
RDW: 14.7 % (ref 11.5–15.5)
WBC: 6.4 10*3/uL (ref 4.0–10.5)
WBC: 7.8 10*3/uL (ref 4.0–10.5)

## 2010-08-01 LAB — HEPARIN LEVEL (UNFRACTIONATED): Heparin Unfractionated: <0.1 IU/mL — ABNORMAL LOW (ref 0.30–0.70)

## 2010-08-01 LAB — POCT CARDIAC MARKERS
CKMB, poc: 2.7 ng/mL (ref 1.0–8.0)
CKMB, poc: 3.7 ng/mL (ref 1.0–8.0)
Myoglobin, poc: 59 ng/mL (ref 12–200)
Myoglobin, poc: 70.8 ng/mL (ref 12–200)
Troponin i, poc: 0.42 ng/mL (ref 0.00–0.09)
Troponin i, poc: 0.43 ng/mL (ref 0.00–0.09)

## 2010-08-01 LAB — CARDIAC PANEL(CRET KIN+CKTOT+MB+TROPI)
CK, MB: 2.7 ng/mL (ref 0.3–4.0)
Relative Index: INVALID (ref 0.0–2.5)
Relative Index: INVALID (ref 0.0–2.5)
Total CK: 79 U/L (ref 7–232)
Troponin I: 1.06 ng/mL (ref 0.00–0.06)
Troponin I: 1.2 ng/mL (ref 0.00–0.06)

## 2010-08-01 LAB — CK TOTAL AND CKMB (NOT AT ARMC): CK, MB: 3.6 ng/mL (ref 0.3–4.0)

## 2010-08-01 LAB — LIPID PANEL
LDL Cholesterol: 125 mg/dL — ABNORMAL HIGH (ref 0–99)
Total CHOL/HDL Ratio: 6 RATIO
VLDL: 24 mg/dL (ref 0–40)

## 2010-08-01 LAB — PROTIME-INR: INR: 1 (ref 0.00–1.49)

## 2010-09-06 NOTE — Discharge Summary (Signed)
NAMEJEEVAN, [name]             ACCOUNT NO.:  [id number]   MEDICAL RECORD NO.:  [id number]          PATIENT TYPE:  INP   LOCATION:  1424                         FACILITY:  [facility]   PHYSICIAN:  Michaelyn Barter, M.D. DATE OF BIRTH:  06-08-1955   DATE OF ADMISSION:  10/14/2006  DATE OF DISCHARGE:  10/15/2006                               DISCHARGE SUMMARY   PRIMARY CARE DOCTOR:  Unassigned.   FINAL DIAGNOSES:  1. Chest pain.  2. Shortness of breath.  3. Leukocytosis.   PROCEDURES:  1. Portable chest x-ray completed October 14, 2006.  2. CT scan of the chest with angiographic contrast completed October 14, 2006.   HISTORY OF PRESENT ILLNESS:  [name] is a 55 year old gentleman who  states that he developed shortness of breath last night.  He states that  he was at bed at the time.  He went to turn over in bed and suddenly he  had an episode of shortness of breath.  He states that later he began to  develop some chest pain which was centrally located.  It was accompanied  by some radiation down his right arm.  He states that his chest pain  occurred primarily with deep inspirations.  He stated that he had  similar pain a long time ago, at which time he was told that it was  secondary to chest wall pain.  He stated that he felt as if there was  someone stepping on his chest.  Certain movements made his pain worse.  There was no progression of the shortness of breath, it simply remained  constant.  He stated that Friday, which was 2 days prior to this  admission, he was painting one of the doors of his home and subsequently  while painting, his right arm and shoulder began to hurt.  He initially  attributed all of his symptoms to painting of the door.   For past medical history, please see that dictated by Dr. Michaelyn Barter.   HOSPITAL COURSE:  #1 - CHEST PAIN.  The patient's description of his  chest pain had atypical features.  The patient indicated that he did  have  a history of anxiety attacks.  A chest x-ray was completed of the  patient's chest on June 22 that revealed chronic bronchial thickening.  No acute airspace process or edema.  CT scan of the patient's chest with  angiographic contrast revealed no evidence of pulmonary emboli,  COPD/emphysema.  A new linear scar or atelectasis in the superior  segment left lower lobe since the prior CTA chest done 2006 was seen.  No acute cardiopulmonary disease otherwise.  The patient had an EKG  completed which revealed normal sinus rhythm, no Q waves, no obvious ST-  segment abnormality changes.  The patient's cardiac markers were also  cycled.  His troponin-I was 0.01.  His CK-MB 1.6.  Repeat CK-MB was 1.9  and repeat troponin I was 0.02.  The patient's point-of-care markers  revealed a troponin-I POC of less than 0.05, with CK-MB POC of 2.3.  The  patient's cardiac  markers were negative.  A fasting lipid profile was  completed.  It showed a cholesterol level of 231, triglycerides of 263  and LDL of 144.  The patient indicated that shortly after his  presentation into the hospital's emergency department and prior to him  receiving IV nitroglycerin, his chest pain completely resolved, as did  his shortness of breath.  He stated that he believed that his symptoms  were secondary to anxiety, and by the second day of the patient's  admission he stated that he was completely pain-free and that he felt  back to his baseline.  I, however, called [facility] and  have  requested that a stress test be completed.  The patient agreed to follow  up with [facility] to have a stress test completed.   #2 - HYPERLIPIDEMIA.  Again, a fasting lipid profile was completed and  it revealed a cholesterol of 231, triglycerides of 263, LDL of 144.   #3 - LEUKOCYTOSIS.  When the patient first arrived into the hospital his  white count was slightly elevated at 14.1.  By the second day of  hospitalization the  patient's white blood cell count was 7.0.  He had no  other obvious signs of ongoing infection.  Therefore, the source of the  patient's initial leukocytosis is questionable.   CONDITION AT THE TIME OF DISCHARGE:  Currently, the patient indicates  that he is completely chest-pain and shortness-of-breath free.  Vital  signs:  His temperature is 97.7, heart rate 79, respirations 20, blood  pressure 124/67, O2 saturation 98% on 2 L.  The decision has been made  to discharge the patient from the hospital.   Because the patient does have multiple cardiovascular risk factors  including a father who died while in his early 64s, a history of  cigarette smoking, and a fasting lipid profile that is positive for  hyperlipidemia, the decision was made to call [facility].  A  stress test has been arranged for October 23, 2006, at 11:45 a.m., which is  only 1 week away.      Michaelyn Barter, M.D.  Electronically Signed     OR/MEDQ  D:  10/15/2006  T:  10/16/2006  Job:  [id number]

## 2010-09-06 NOTE — H&P (Signed)
[name] NO.:  [id number]   MEDICAL RECORD NO.:  [id number]          PATIENT TYPE:  INP   LOCATION:  1824                         FACILITY:  [facility]   PHYSICIAN:  Audery Amel, MD    DATE OF BIRTH:  Mar 21, 1956   DATE OF ADMISSION:  09/27/2008  DATE OF DISCHARGE:                              HISTORY & PHYSICAL   PRIMARY CARDIOLOGIST:  Unassigned.   CHIEF COMPLAINT:  Left arm pain.   HISTORY OF PRESENT ILLNESS:  [name] is a 55 year old white male  with no prior history of coronary disease who presents to the emergency  department for evaluation of left arm pain and chest pain.  The patient  states the symptoms began Wednesday evening while at rest.  He  characterized the onset of very severe left arm pain which radiated to  the left jaw.  He states the symptoms lasted approximately 4 hours and  then resolved spontaneously.  He denied any associated shortness of  breath, nausea, vomiting, diaphoresis, diaphoresis, palpitations,  presyncope or syncope.  Of note, the patient has had similar symptoms in  the past related to cervical disk disease for which he has undergone  spinal fusion of the C5 and C6 interspaces.  He felt that his symptoms  were likely related to this and therefore did not seek any medical  treatment.  He had no additional episodes until today.  He characterized  the onset of sharp substernal chest pain.  He denied any radiation to  his arm, neck, or jaw.  There is no associated PND or orthopnea.  EKG on  presentation revealed normal sinus rhythm with lateral ST depression.  His initial troponin by point care testing was 0.43.  Currently he is  hemodynamically stable and otherwise without complaints.   PAST MEDICAL HISTORY:  1. Status post C5-6 spinal fusion in 2009.  2. Hypertension.   MEDICATIONS:  1. Symbicort 160/4.5 b.i.d.  2. Neurontin 600 mg t.i.d.  3. Lorcet 10 one p.o. q.8 hours.  4. Amlodipine 10 mg daily.   SOCIAL HISTORY:  The patient lives in [place] with his mother.  He  is currently seeking disability but does odds and ends jobs.  He smokes  approximately 10 cigarettes per day but has smoked up to 2 packs per day  in the past.  He denies any alcohol or illicit substances.   FAMILY HISTORY:  Mother is alive and well with no major medical  problems.  Father died from a myocardial infarction.  He has 2 brothers  who are alive and well.  He has one sister who died at birth.   REVIEW OF SYSTEMS:  As per HPI, otherwise complete review of systems  obtained and negative except as documented.   PHYSICAL EXAMINATION:  VITAL SIGNS:  Blood pressure is 119/62, heart  rate 79, O2 sats 94% on room air.  GENERAL:  The patient is alert and oriented x3 in no acute distress,  pleasantly conversant.  HEENT:  Normocephalic, atraumatic, EOMI, nares patent, OMP is clear  without erythema or exudate.  The patient does have limited range  of  motion with his neck related to his spinal fusion.  His carotid  upstrokes are equal and symmetric bilaterally with no audible bruits.  There is no appreciable JVD.  There is no palpable thyromegaly or  lymphadenopathy.  CHEST:  Clear to auscultation bilaterally.  CARDIOVASCULAR:  Normal S1 and S2 with no audible murmurs, rubs or  gallops.  PMI is nondisplaced in left midclavicular line.  Peripheral  pulses are 2+ and symmetric bilaterally.  ABDOMEN:  Soft, nontender, nondistended, and bowel sounds.  No  hepatosplenomegaly.  EXTREMITIES:  Revealed no clubbing, cyanosis or edema.  No evidence of  inflammation ulceration.  NEUROLOGIC:  Grossly nonfocal.   EKG by my interpretation reveals normal sinus rhythm with lateral ST  depression which appears new compared with prior studies.   LABS:  White count 7.7, hemoglobin 13.5, hematocrit 39.7, platelet count  343,000, CK-MB 3.7, troponin-I 0.43.  Sodium 140, potassium 3.4,  chloride 106 willing CO2 is 25, BUN 4,  creatinine 0.56, glucose 89.   IMPRESSION:  1. Non-ST-elevation myocardial infarction.  2. Hypertension.  3. Status post C5-6 fusion.   PLAN:  Will admit the patient to telemetry for further evaluation and  monitoring.  Although we will cycle his cardiac biomarkers to establish  the trend, it is unclear as to whether or not the patient's acute  coronary syndrome occurred on Wednesday of this past week or if this is  coincident with the chest pain that onset today.  We will continue on  fractionated heparin drip as well as aspirin therapy.  There is no  indication for additional antiplatelet therapy at this time.  Will  initiate atorvastatin 80 mg p.o. daily.  We will continue the rest of  his home medications.  Will initiate metoprolol 25 mg p.o. q.12 hours.  Will maintain the patient n.p.o. after midnight in anticipation of a  left heart catheterization and possible PCI in the morning.      Audery Amel, MD  Electronically Signed     SHG/MEDQ  D:  09/27/2008  T:  09/27/2008  Job:  [id number]

## 2010-09-06 NOTE — Cardiovascular Report (Signed)
NAMEPARISH, [name] NO.:  [id number]   MEDICAL RECORD NO.:  [id number]          PATIENT TYPE:  INP   LOCATION:  2503                         FACILITY:  [facility]   PHYSICIAN:  Corky Crafts, MDDATE OF BIRTH:  July 17, 1955   DATE OF PROCEDURE:  09/28/2008  DATE OF DISCHARGE:  09/29/2008                            CARDIAC CATHETERIZATION   REFERRING PHYSICIAN:  Armanda Magic, MD   PRIMARY CARE PHYSICIAN:  [facility].   PROCEDURES PERFORMED:  Left heart catheterization, left ventriculogram,  coronary angiogram, abdominal aortogram, and percutaneous coronary  intervention of the right coronary artery.   OPERATOR:  Corky Crafts, MD   INDICATION:  Non-ST-segment elevation MI.   PROCEDURE:  The risks and benefits of cardiac catheterization were  explained to the patient and informed consent was obtained.  He was  brought to the cath lab.  He was prepped and draped in the usual sterile  fashion.  His right groin was inflated with 1% lidocaine.  A 6-French  sheath was placed into the right femoral artery using a modified  Seldinger technique.  Left coronary artery angiography was performed  using a JL-4.0 catheter.  The catheter was advanced to the vessel ostium  under fluoroscopic guidance.  Digital angiography was performed in  multiple projections using hand injection of contrast.  The catheter was  exchanged for a JR-4 catheter, which she used to obtain angiography of  the right coronary artery in a similar fashion.  Pigtail catheter was  advanced to the ascending aorta and across the aortic valve under  fluoroscopic guidance.  Power injection of contrast was performed in the  RAO projection to image the left ventricle.  The catheter was pulled  back under continuous hemodynamic pressure monitoring.  The catheter was  then withdrawn to the abdominal aorta and a power injection of contrast  was performed in the AP projection.  The PCI of the right  coronary  artery was then performed.  Please see below for details.   FINDINGS:  1. The left main was widely patent.  2. Left circumflex had a 60% hazy proximal lesion.  This was a small      vessel.  3. The OM-1 was branching vessel with moderate diffuse disease.  4. The ramus was a large vessel with mild diffuse atherosclerosis.  5. Left anterior descending was a medium-sized vessel and was      calcified.  There is a 60% midvessel lesion and distal vessel was      diffusely diseased.  6. The right coronary artery was occluded in the mid vessel.  There      appeared to be a small area of dissection proximally.  Ventriculogram showed inferobasal akinesis with an estimated ejection  fraction of 45-50%.  1. The abdominal aortogram showed mild atherosclerosis.  There is a      25% proximal right renal artery stenosis.  The left renal artery      was widely patent.   HEMODYNAMICS:  1. Ventricle pressure is 112/5 with an LVEDP of 16 mmHg.  Aortic      pressure is 112/60 with  a mean aortic pressure of 81 mmHg.  The      left common iliac lesion had a calcified 50% lesion.  2. PCI narrative, a JR-4 guiding catheter with side holes was used a      Prowater wire was placed down the vessel.  It was used to cross the      lesion into the distal RCA 2.0 x 12.  Apex was then placed across      the lesion and inflated to 6 atmospheres for 25 seconds.  Then,      again 6 atmospheres for 25 seconds.  Subsequently, inflated 10      atmospheres for 22 seconds and then 8 atmospheres for 20 seconds      with 2.0 x 28 MiniVision stent was placed across the more distal      portion of the lesion, and deployed at 12 atmospheres for 45      seconds.  A 2.5 x 12 MiniVision stent was then placed in the      proximal vessel at the area of dissection and deployed at 12      atmospheres for 35 seconds.  The 2.5 x 15 Quantum apex balloon was      inflated to 16 atmospheres for 40 seconds and then for 14       atmospheres for 22 seconds in the more distal stent and proximal      stent was postdilated with 3.0 x 8-mm Quantum apex balloon inflated      to 12 atmospheres for 40 seconds and then 12 atmospheres for 30      seconds.   IMPRESSION:  1. Diffuse coronary artery disease with a most severe disease      affecting the right coronary artery.  2. Successful bare-metal stent placement to the right coronary artery.  3. Mildly decreased left ventricular ejection fraction of 45-50% with      normal hemodynamics.  4. Peripheral vascular disease at the right common femoral artery and      apparently right superficial femoral artery along with the left      common iliac artery.   RECOMMENDATIONS:  Continue aspirin and Plavix.  Recommend smoking  cessation.  If he has claudication, he would benefit from a further PV  workup.      Corky Crafts, MD  Electronically Signed     JSV/MEDQ  D:  11/10/2008  T:  11/11/2008  Job:  [id number]

## 2010-09-06 NOTE — Op Note (Signed)
NAMELEDELL, CODRINGTON NO.:  [id number]   MEDICAL RECORD NO.:  [id number]          PATIENT TYPE:  OIB   LOCATION:  3526                         FACILITY:  [facility]   PHYSICIAN:  Cristi Loron, M.D.DATE OF BIRTH:  January 15, 1956   DATE OF PROCEDURE:  02/19/2008  DATE OF DISCHARGE:                               OPERATIVE REPORT   BRIEF HISTORY:  The patient is a 55 year old white male who suffered  from neck and arm pain consistent with a C5 and C7 radiculopathy.  He  failed medical management, worked up with cervical MRI which  demonstrated the patient had degenerative disk disease, spondylosis, and  stenosis at C4-C5 and C6-C7.  I discussed various treatment options with  the patient including surgery.  The patient has weighed the risks,  benefits, and alternatives of surgery and decides to proceed with C4-C5  and C6-C7 anterior cervicectomy, fusion, and plating.   PREOPERATIVE DIAGNOSIS:  C4-C5 and C6-C7 degenerative disk disease,  spondylosis, stenosis, cervical myelopathy/radiculopathy, and  cervicalgia.   POSTOPERATIVE DIAGNOSIS:  C4-C5 and C6-C7 degenerative disk disease,  spondylosis, stenosis, cervical myelopathy/radiculopathy, and  cervicalgia.   PROCEDURE:  C4-C5 and C6-C7 extensive anterior cervical  diskectomy/decompression; C4-C5 and C6-7 anterior interbody arthrodesis  with local morselized autograft bone and Actifuse bone graft extender;  insertion of C4-C5 and C6-C7 interbody prosthesis (Alphatec PEEK  interbody prosthesis); C4-C5 and C6-C7 anterior cervical plating with  Codman Slim-Lock titanium plate and screws.   SURGEON:  Cristi Loron, MD   ASSISTANT:  Payton Doughty, MD   ANESTHESIA:  General endotracheal.   ESTIMATED BLOOD LOSS:  100 mL.   SPECIMENS:  None.   DRAINS:  None.   COMPLICATIONS:  None.   DESCRIPTION OF PROCEDURE:  The patient was brought to the operating room  by the anesthesia team.  General endotracheal  anesthesia was induced.  The patient remained in the supine position.  A roll was placed in his  shoulder, placing his neck in slight extension.  His anterior cervical  region was shaved with clippers and prepared with Betadine scrub and  Betadine solution.  Sterile drapes were applied.  I then injected the  area to be incised with Marcaine with epinephrine solution.  I used a  scalpel to make a transverse incision in the patient's left anterior  neck.  I used Metzenbaum scissors to divide the platysma muscle and then  to dissect medial to sternocleidomastoid muscle, jugular vein, and  carotid artery.  I carefully dissected down towards the anterior  cervical spine, identifying esophagus, retracting it medially.  We then  used Kittner swabs to clear soft tissue from the anterior cervical spine  and inserted a bent spinal needle into the exposed intervertebral disk  space.  We obtained intraoperative radiographs to confirm our location.   We then used electrocautery to detach the medial border of the longus  colli muscle bilaterally from C4-C5 and C6-C7 intervertebral disk space.  We began the decompression at C4-C5.  We inserted a Caspar self-  retaining retractor underneath the longus colli muscle bilaterally at C4-  C5 for exposure.  We incised the C4-C5 intervertebral disk with a 15  blade scalpel and performed a partial intervertebral diskectomy with the  pituitary forceps and Karlin curettes.  We then inserted distraction  screws at C4 and C5, distracted interspace and then used a high-speed  drill to decorticate the vertebral endplates C4-C5, to drill away the  remainder of C4-C5 intervertebral disk, to drill away some posterior  spondylosis and to thin out the posterior longitudinal ligament.  We  then incised ligament with arachnoid knife and then removed with  Kerrison punch undercutting the vertebral endplates decompressing the  thecal sac.  We then performed foraminotomies  about the bilateral C5  nerve root completing the decompression.   We then repeated this procedure in an analogous fashion at C6-C7,  decompressing the C6-C7 thecal sac and bilateral C7 nerve roots.   Having completed decompression, we now turned our attention to  arthrodesis.  We used trial spacers and determined to use an 8-mm medium  PEEK prosthesis at C6-C7 and 6 mm at C4-C5.  We prefilled this  prosthesis with a combination of local autograft bone.  We obtained  decompression as well as Actifuse bone graft extender.  We inserted the  prosthesis and distracted interspaces and then removed the distraction  screws.  There was good snug fit of the prosthesis at both levels.   We now turned our attention to anterior spinal instrumentation.  We used  a high-speed drill to remove some ventral spondylosis from the vertebral  endplates of C4-C5 and C6-C7, so that the plate would lie down flat.  We  selected the appropriate Codman Slim-Lock anterior cervical plate and  laid along the anterior aspect of the vertebral bodies at C4-C5 and C6-  C7.  We then drilled two 40 mm holes at C4-C5 and C6-C7.  We secured the  plate to the vertebral bodies by placing two 40-mm screws at C4-C5 and  two at C5-C6.  We then obtained intraoperative radiograph.  It  demonstrated good position of the upper plate screws.  We could not see  the lower plate screws because of the patient's shoulders but they  looked good in vivo.  We therefore secured these screws and plate by  locking each cam.  This completed the instrumentation.   We then obtained hemostasis using bipolar electrocautery.  We irrigated  the wound out with bacitracin solution.  We then removed the retractors.  We inspected the esophagus for any damage and there was none apparent.  We then reapproximated the patient's platysma muscle with interrupted 3-  0 Vicryl suture, the subcutaneous tissue with interrupted 3-0 Vicryl  suture, and the skin  with Steri-Strips and benzoin.  The wound was then  coated with bacitracin ointment and sterile dressing was applied.  The  drapes were removed.  The patient was subsequently extubated by the  anesthesia team and transported to the Postanesthesia Care Unit in  stable condition.  All sponge, instrument, and needle counts were  correct at the end of this case.      Cristi Loron, M.D.  Electronically Signed     JDJ/MEDQ  D:  02/19/2008  T:  02/20/2008  Job:  [id number]

## 2010-09-06 NOTE — Discharge Summary (Signed)
[name], [name]             ACCOUNT NO.:  [id number]   MEDICAL RECORD NO.:  [id number]          PATIENT TYPE:  INP   LOCATION:  2503                         FACILITY:  [facility]   PHYSICIAN:  Armanda Magic, M.D.     DATE OF BIRTH:  13-Jun-1955   DATE OF ADMISSION:  09/27/2008  DATE OF DISCHARGE:  09/29/2008                               DISCHARGE SUMMARY   DISCHARGE DIAGNOSES:  1. Non-ST-segment elevated myocardial infarction.  2. Coronary artery disease status post bare-metal stent to the right      coronary artery.  3. Mild left ventricular dysfunction, ejection fraction 45-50%.  4. Obesity.  5. Hypertension.  6. Status post back surgery.  7. Smoker, smoking cessation counseling.   [name] is a 55 year old male patient who was admitted to [facility] on September 27, 2008, for evaluation of chest pain.  Symptoms began  on Wednesday prior to admission.  Initially, the pain lasted about 4  hours and resolved spontaneously.  He also had a left arm and jaw pain.  On the day of admission, he developed sharp chest pain without  radiation.  He was admitted to the hospital for further evaluation.   Interestingly, his CK-MBs were normal, but his troponin was elevated at  1.0.  We felt this was consistent with a non-ST-segment elevated  myocardial infarction.   On September 28, 2008, he went to the Cardiac Catheterization Lab and was  found to have a mid calcified 60-70% LAD lesion.  Right coronary artery  was occluded in the midvessel.  The circumflex had a 60-70% hazy  proximal lesion.  LV gram showed inferior basal hypokinesis with an EF  around 50%.  Abdominal aortogram showed a 25% proximal right renal  artery stenosis with a left renal patent.  There was a calcified 50%  left common iliac lesion.   With these findings, Dr. Eldridge Dace worked on the right coronary artery  and implanted 2 bare-metal stents without difficulty.   The patient remained in the hospital overnight.  Lab  studies show a BUN  of 6, creatinine 0.58, hemoglobin 13.2, hematocrit 38.3.  He was  discharged home in stable but improved condition.   DISCHARGE MEDICATIONS:  1. Lopressor 25 mg p.o. b.i.d.  2. Crestor 40 mg a day.  3. Neurontin 600 mg 1 p.o. t.i.d.  4. Enteric-coated aspirin 325 mg a day.  5. Plavix 75 mg a day.  6. Symbicort 1 puff twice a day.  7. He has been asked to decrease his dose of amlodipine to 5 mg a day.  8. We have added Altace 5 mg 1 p.o. daily for LV dysfunction.   The patient is to remain on low-sodium, heart-healthy diet.  Increase  activity slowly.  No lifting for 2 weeks.  No driving for 1 week.  Follow up with Dr. Mayford Knife on October 13, 2008, at 9:30 a.m. and then follow  up with Dr. Norris Cross office for labs on November 10, 2008, at 8:00 a.m.      Guy Franco, P.A.      Armanda Magic, M.D.  Electronically Signed  LB/MEDQ  D:  11/03/2008  T:  11/03/2008  Job:  [id number]

## 2010-09-06 NOTE — H&P (Signed)
[name] NO.:  [id number]   MEDICAL RECORD NO.:  [id number]          PATIENT TYPE:  EMS   LOCATION:  ED                           FACILITY:  [facility]   PHYSICIAN:  Michaelyn Barter, M.D. DATE OF BIRTH:  12/03/1955   DATE OF ADMISSION:  10/14/2006  DATE OF DISCHARGE:                              HISTORY & PHYSICAL   STAT HISTORY AND PHYSICAL   PRIMARY CARE PHYSICIAN:  Yet unassigned.  He goes to [employer].   CHIEF COMPLAINT:  Can't breathe/chest pain.   HISTORY OF PRESENT ILLNESS:  [name] is a 55 year old gentleman,  who states that he developed shortness of breath  last night.  He said  he was in bed and went to turn over when he suddenly felt short of  breath.  He has since developed some chest pain, which is centrally  located.  It is accompanied by radiation down his right arm.  He just  states it hurts whenever he takes deep inspirations.  He had similar  symptoms a long time ago.  At that particular time, he was told that it  was chest wall pain.  He currently states that he feels as if someone is  stepping on his chest.  Certain movements make the pain worse.  His  breathing has remained about the same since he initially developed his  shortness of breath.  He denies having any nausea, vomiting, fever, or  chills.  He has an occasional nonproductive cough.  He states that  Friday, which is two days ago, he painted one of the doors at his home.  Subsequent, his right arm and shoulder have begun to hurt.  He initially  attributed all of his symptoms to painting.   PAST MEDICAL HISTORY:  1. The patient states that he has two vertebral disks that are causing      a pinched nerve.  2. Hypertension.  3. Anxiety attacks.  4. Alcohol-related DTs.  5. Alcohol-related seizures.   ALLERGIES:  No known drug allergies.   HOME MEDICATIONS:  1. Neurontin 600 mg t.i.d.  2. Lortab.   SOCIAL HISTORY:  1. The patient admits to be a recovering  alcoholic.  His last drink      was January 02, 2006.  He has gone to rehab multiple times in the      past.  2. Cocaine:  The patient denies the use any street drugs.   FAMILY HISTORY:  Mother has diabetes mellitus, father died at age 50  from a heart attack.   REVIEW OF SYSTEMS:  As per HPI.   PHYSICAL EXAMINATION:  GENERAL:  The patient is awake.  He uses  accessory muscles to breathe.  He is cooperative.  He speaks in full  sentences.  VITAL SIGNS:  His temperature is 96.6, blood pressure 156/114, heart  rate 102, respirations 22, O2 SAT 100%.  HEENT:  Atraumatic and normocephalic, anicteric, extraocular movements  are intact, oral mucosa is pink, no thrush and no exudates.  NECK:  Supple, no JVD, no lymphadenopathy, strong carotid upstrokes  bilaterally.  CARDIAC:  S1 S2  present, regular rate and rhythm, no parasternal heave,  positive nondisplaced PMI.  ABDOMEN:  Soft, nontender, and nondistended, positive bowel sounds.  CHEST:  Slight reproductive chest wall pain is present in the central  area of the patient's chest.  EXTREMITIES:  No leg edema.  NEUROLOGIC:  The patient is alert and oriented x3, cranial nerves II-XII  are intact.  MUSCULOSKELETAL:  5 out of 5 upper and lower extremity strength.   EKG reveals normal sinus rhythm.   LABORATORY DATA:  1. A troponin-I of 0.04, a CK-MB of 1.9.  His sodium is 137, potassium      4.6, chloride 106, CO2 24, glucose 118, BUN is 8, creatinine is      0.71.  Bilirubin total is 0.4, alkaline-phosphatase 97, SGOT 17,      SGPT 15, total protein is 6.9, albumin 3.7, calcium 9.7.  A D-dimer      was done; it was 0.38.  CBC reveals a WBC count of 14.1, hemoglobin      of 16.5, hematocrit 48.5, platelet count 360.  A CT scan of the      patient's chest revealed no evidence of pulmonary embolism.  2. COPD, emphysema.  New linear scar or atelectasis in the superior      segment left lower lobe since the prior CT of chest 2006.  No  acute      cardiopulmonary disease was seen.  3. Stable bilateral lung nodules since the prior x-ray.   ASSESSMENT AND PLAN:  1. Chest pain.  The etiology of this is cardiac versus noncardiac.      The patient has some atypical features to his current chest pain      making cardiac less likely.  Will admit the patient and rule the      patient out for a myocardial infarction.  Will check his troponin-I      as well as CK-MB x3 q.8h apart.  Will check a two-D echocardiogram.      Will provide the patient with morphine, oxygen, nitroglycerin,      aspirin, and will check a fasting lipid profile as well as a urine      drug screen.  The patient has had an electrocardiogram completed      and it reveals normal sinus rhythm with good R-wave progression,      with transition being achieved by lead V-3.  No obvious Q-waves or      ST segment abnormalities are observed.  2. Hypertension.  The patient currently is not taking any      antihypertensive medications.  His blood pressure was initially      elevated.  Will consider starting a beta blocker versus another      antihypertensive medication on the patient.  3. History of alcohol abuse.  The patient currently shows no obvious      signs of withdrawal.  Will monitor this closely for now.  4. Gastrointestinal prophylaxis.  Will provide Protonix.  5. Deep venous thrombosis prophylaxis.  Will provide Lovenox.  6. Mild leukocytosis.  At present, the patient's leukocytosis is      questionable.  Again, chest x-ray and computerized tomography scan      of the patient's chest have both been completed.  There are no      obvious acute signs of infection via either of these.  Will check a      urinalysis for now.      Michaelyn Barter, M.D.  Electronically  Signed    OR/MEDQ  D:  10/14/2006  T:  10/14/2006  Job:  [id number]

## 2010-09-09 NOTE — Discharge Summary (Signed)
NAMECLEMENTE, DEWEY NO.:  [id number]   MEDICAL RECORD NO.:  [id number]          PATIENT TYPE:  IPS   LOCATION:  0304                          FACILITY:  [facility]   PHYSICIAN:  Jasmine Pang, M.D. DATE OF BIRTH:  [birth year]   DATE OF ADMISSION:  01/02/2006  DATE OF DISCHARGE:  01/08/2006                                 DISCHARGE SUMMARY   IDENTIFICATION:  The patient is a 55 year old single Caucasian male who was  admitted on a voluntary basis.   HISTORY OF PRESENT ILLNESS:  The patient is a 55 year old male with a  history of alcohol dependence.  He requested detox.  He has been drinking 6-  12 beers every day.  Family told the ED physician that he was drinking daily  for the past 10 years.  He has been getting abusive and violent when  drinking.  He has been living with his mother and the family has been  concerned about her safety since he gets angry at her.  This is the first  [facility] admission for this patient.  He has had a prior detox in the spring of  2007 at [facility].  He remained there for four months.  He has  used Seroquel and lithium in the past but states I couldn't take these.  He is on no current medications at this point for psychiatric needs.  He  describes mainly being anxious.  For further admission information, see  psychiatric admission assessment.   PHYSICAL EXAMINATION:  Physical examination was done in the ED.   LABORATORY DATA:  Hepatic panel was within normal limits.  Magnesium 2.4  (1.5-2.5).  TSH was 1.500, which was normal.  Other labs were done in the ED  and evaluated by the ED physician.   HOSPITAL COURSE:  Upon first meeting the patient, he was here for detox.  He  stated I've got an inflamed liver.  His family physician had apparently  told him that he would develop liver problems if he kept using alcohol.  He  talked about living with his mother who was 73 years old.  He came to the  hospital because he was afraid of  his health problems and he was afraid of  his history of DTs and alcohol-related seizures.  On January 03, 2006, the  patient was doing pretty well.  Mother came to visit.  Family session is  scheduled.  The patient felt anxious about discharge on Saturday, January 06, 2006 which was the original plan.  He wants to go to longer term  treatment.  He has a history of panic attacks.  He stuttered as a child and  was teased.  He started skipping school according to him.  I began Neurontin  100 mg p.o. t.i.d. for anxiety.  On January 05, 2006, the patient was  anxious about the family session.  We discussed his drinking and subsequent  anger/agitation when intoxicated.  He denied this even though family states  this is true.  He states he is committed to maintaining his sobriety.   On January 06, 2006,  the discharge was canceled for this day because he  was somewhat agitated about this family session.  He was afraid of his  behavior if he returned home.  Long-term treatment was being arranged so he  will not have to go home.  He stated he was anxious.  On January 07, 2006,  the patient stated I'm doing good.  He is not in pain anymore when he gets  up.  Once in a while he states he thinks of alcohol.  He states he feels  good about going to the treatment center.  However, he was hoping he could  go home first which apparently was not the plan.   On January 08, 2006, the patient's mental status had improved markedly.  He was friendly, verbal and cooperative.  He had good eye contact.  His  speech was still somewhat soft and slow.  His mood had improved.  His mood  was less depressed and anxious.  Affect wide range.  No suicidal or  homicidal ideation.  No ideation for self-injurious behavior.  He stated  there were no auditory or visual hallucinations.  No paranoia or delusions.  The thoughts logical and goal directed.  Thought content with no predominant  theme.  Cognitive was  back to baseline.   DISCHARGE DIAGNOSES:  AXIS I:  Alcohol dependence.  Anxiety disorder not  otherwise specified.  AXIS II:  No diagnosis.  AXIS III:  No diagnosis.  AXIS IV:  Severe (social problems associated with drinking, problems with  primary support group, housing problems).  AXIS V:  GAF upon discharge 50; GAF upon admission 30; GAF highest past year  1.   ACTIVITY/DIET:  There were no specific activity level or dietary  restrictions.   DISCHARGE MEDICATIONS:  Neurontin 100 mg p.o. t.i.d. p.r.n. anxiety.   POST-HOSPITAL CARE PLANS:  The patient is scheduled to be seen by Dr. Lang Snow  at the [facility] on Thursday, January 11, 2006 at 10:30 a.m.  He is to call about [facility] which is located in [place] and ask for Misty Stanley to discuss an inpatient stay.  This was not able to  be arranged prior to his discharge.      Jasmine Pang, M.D.  Electronically Signed     BHS/MEDQ  D:  01/08/2006  T:  01/08/2006  Job:  [id number]

## 2011-01-24 LAB — BASIC METABOLIC PANEL
BUN: 5 — ABNORMAL LOW
CO2: 24
Chloride: 104
Glucose, Bld: 121 — ABNORMAL HIGH
Potassium: 4

## 2011-01-24 LAB — CBC
HCT: 43.5
MCHC: 34.1
MCV: 89.9
Platelets: 310
WBC: 6.9

## 2011-01-30 ENCOUNTER — Observation Stay (HOSPITAL_COMMUNITY)
Admission: EM | Admit: 2011-01-30 | Discharge: 2011-01-30 | Disposition: A | Discharge status: Home / Self Care | Payer: Medicaid Other | Source: Other Acute Inpatient Hospital | Attending: Cardiology | Admitting: Cardiology

## 2011-01-30 ENCOUNTER — Emergency Department (HOSPITAL_COMMUNITY): Payer: Medicaid Other

## 2011-01-30 ENCOUNTER — Emergency Department (HOSPITAL_COMMUNITY)
Admission: EM | Admit: 2011-01-30 | Discharge: 2011-01-30 | Disposition: A | Discharge status: Other Acute Inpatient Hospital | Payer: Medicaid Other | Source: Home / Self Care | Attending: Emergency Medicine | Admitting: Emergency Medicine

## 2011-01-30 DIAGNOSIS — Z01810 Encounter for preprocedural cardiovascular examination: Secondary | ICD-10-CM | POA: Insufficient documentation

## 2011-01-30 DIAGNOSIS — Z9861 Coronary angioplasty status: Secondary | ICD-10-CM | POA: Insufficient documentation

## 2011-01-30 DIAGNOSIS — Z01812 Encounter for preprocedural laboratory examination: Secondary | ICD-10-CM | POA: Insufficient documentation

## 2011-01-30 DIAGNOSIS — Z8249 Family history of ischemic heart disease and other diseases of the circulatory system: Secondary | ICD-10-CM | POA: Insufficient documentation

## 2011-01-30 DIAGNOSIS — Z01818 Encounter for other preprocedural examination: Secondary | ICD-10-CM | POA: Insufficient documentation

## 2011-01-30 DIAGNOSIS — M79609 Pain in unspecified limb: Secondary | ICD-10-CM | POA: Insufficient documentation

## 2011-01-30 DIAGNOSIS — I10 Essential (primary) hypertension: Secondary | ICD-10-CM | POA: Insufficient documentation

## 2011-01-30 DIAGNOSIS — E785 Hyperlipidemia, unspecified: Secondary | ICD-10-CM | POA: Insufficient documentation

## 2011-01-30 DIAGNOSIS — I2510 Atherosclerotic heart disease of native coronary artery without angina pectoris: Principal | ICD-10-CM | POA: Insufficient documentation

## 2011-01-30 DIAGNOSIS — I200 Unstable angina: Secondary | ICD-10-CM | POA: Insufficient documentation

## 2011-01-30 LAB — BASIC METABOLIC PANEL
BUN: 9 mg/dL (ref 6–23)
Chloride: 102 mEq/L (ref 96–112)
Glucose, Bld: 101 mg/dL — ABNORMAL HIGH (ref 70–99)
Potassium: 4.1 mEq/L (ref 3.5–5.1)

## 2011-01-30 LAB — DIFFERENTIAL
Basophils Absolute: 0 K/uL (ref 0.0–0.1)
Basophils Relative: 0 % (ref 0–1)
Eosinophils Absolute: 0.5 10*3/uL (ref 0.0–0.7)
Eosinophils Relative: 4 % (ref 0–5)
Lymphocytes Relative: 20 % (ref 12–46)
Lymphs Abs: 2.5 10*3/uL (ref 0.7–4.0)
Monocytes Absolute: 1 K/uL (ref 0.1–1.0)
Monocytes Relative: 8 % (ref 3–12)
Neutro Abs: 8.3 K/uL — ABNORMAL HIGH (ref 1.7–7.7)
Neutrophils Relative %: 68 % (ref 43–77)

## 2011-01-30 LAB — BASIC METABOLIC PANEL WITH GFR
CO2: 31 meq/L (ref 19–32)
Calcium: 9.4 mg/dL (ref 8.4–10.5)
Creatinine, Ser: 0.7 mg/dL (ref 0.50–1.35)
GFR calc Af Amer: >90 mL/min (ref >90)
GFR calc non Af Amer: >90 mL/min (ref >90)
Sodium: 138 meq/L (ref 135–145)

## 2011-01-30 LAB — CBC
HCT: 42.6 % (ref 39.0–52.0)
Hemoglobin: 14.1 g/dL (ref 13.0–17.0)
MCH: 30.7 pg (ref 26.0–34.0)
MCHC: 33.1 g/dL (ref 30.0–36.0)
MCV: 92.6 fL (ref 78.0–100.0)
Platelets: 252 10*3/uL (ref 150–400)
RBC: 4.6 MIL/uL (ref 4.22–5.81)
RDW: 14.2 % (ref 11.5–15.5)
WBC: 12.3 10*3/uL — ABNORMAL HIGH (ref 4.0–10.5)

## 2011-01-30 LAB — PROTIME-INR
INR: 0.89 (ref 0.00–1.49)
Prothrombin Time: 12.2 s (ref 11.6–15.2)

## 2011-01-30 LAB — APTT: aPTT: 28 s (ref 24–37)

## 2011-02-02 NOTE — Cardiovascular Report (Signed)
[name], GASSMAN NO.:  [id number]  MEDICAL RECORD NO.:  [id number]  LOCATION:  WLED                         FACILITY:  [facility]  PHYSICIAN:  Jake Bathe, MD      DATE OF BIRTH:  09/20/1955  DATE OF PROCEDURE:  01/30/2011 DATE OF DISCHARGE:  01/30/2011                           CARDIAC CATHETERIZATION   PRIMARY CARDIOLOGIST:  Armanda Magic, MD  PROCEDURE:  Cardiac catheterization.  CARDIOLOGIST PERFORMING PROCEDURE:  Joslynne Klatt C. Anne Fu, MD  INDICATIONS:  This is a 55 year old man with known coronary artery disease hypertension, hyperlipidemia with early family history of CAD, who presented to [facility] with worsening chest pressure, left arm pain concerning for unstable angina.  Troponin was negative, EKG unremarkable, and no ischemic changes.  However, given his clinical history and recurrent/increasing frequency of chest pain, he was brought to the catheterization lab for further illustration of coronary anatomy.  PROCEDURE DETAILS:  Informed consent was obtained.  Risk of stroke, heart attack, death, renal impairment, arterial damage, bleeding were explained to the patient and his wife at length.  The right radial artery approach was utilized.  Lidocaine 1% was used for local anesthesia.  A 5-French hydrophilic sheath was inserted into the right radial artery without difficulty.  Verapamil 3 mg was administered. Once the [facility] wire traversed the arch with a Judkins right catheter, 3500 units of heparin was administered.  A Judkins right was used to selectively cannulate the right coronary artery and a Judkins left 3.5 was used to selectively cannulate the left main artery. Multiple views with hand injection of Omnipaque were obtained.  A 200 mcg bolus of nitroglycerin was administered via the right coronary artery and the left main artery.  An angled pigtail was then used to cross the left ventricle and left ventriculogram was  performed utilizing 25 mL of contrast in the RAO position under power injection. Hemodynamics were obtained.  Pullback was obtained.  Following the procedure, the radial artery sheath was removed and a Terumo T-band was used for hemostasis.  He tolerated the procedure well.  Versed 2 mg and 50 mcg of fentanyl were utilized.  Allen's test was normal pre and post procedure.  FINDINGS: 1. Right coronary artery.  There are 2 previously placed bare metal     stents in the proximal/mid section of the right coronary artery at     the junction between the proximal and mid stent.  There is a small     wedge-shaped inclusion but no significant stenosis identified.  The     ostium of the RCA appears normal.  The distal section of the RCA     prior to the bifurcation of the PDA demonstrates approximately 40%     narrowing tubular stenosis.  The remainder of the PDA and     posterolateral branch have mild diffuse disease.  Nitroglycerin     increased the diameter of the distal vasculature. 2. Left main artery.  There is calcification throughout the left main  artery.  This vessel is fairly small in caliber.  The spider shot     or LAO caudal shot demonstrates the patency of the left main  artery.  After the shot, there was increased     ventricularization/dampening and nitroglycerin was administered     which helped to increase the diameter of the left main.  The next 3     shots demonstrated that the left mean diameter is not significantly     different from the LAD diameter.  The RAO caudal view demonstrates     a slightly greater diameter left main.  The Judkins left 3.5     catheter is quite significantly angulated almost at a 90-degree     angle to the left main.  There is "blow back" of contrast into the     lumen of the aorta. 3. LAD.  The proximal section is calcified.  There is a high diagonal     branch.  This vessel demonstrates moderate diffuse disease,     approximately 40% stenosis  just prior to the first large septal     branch.  There are 3 diagonal branches, the second of which is very     small in caliber and demonstrates ostial stenosis.  This would not     be amenable to PCI. 4. Circumflex artery.  This vessel is also quite small in caliber and     has moderate diffuse disease, approximately 40% in the proximal     region and a 60-70% ostial/proximal obtuse marginal branch     distally.  This is relatively unchanged from prior study. 5. Left ventriculogram.  There is a basilar inferior wall hypokinesis     with otherwise normal ejection fraction of approximately 55%.  HEMODYNAMICS:  Left ventricular pressure was 100 with an end-diastolic pressure of 13 mmHg.  Aortic pressure was 95-100/60.  There was no mitral regurgitation present.  No gradient across the aortic valve.  IMPRESSION: 1. Previously placed right coronary artery stents, bare metal, in the     proximal and mid section are widely patent. 2. Moderate diffuse disease elsewhere in the right coronary artery in     the distal segment and also in the obtuse marginal, proximal     segment as well as mid left anterior descending which showed no     significant change from prior study. 3. Calcified left main and proximal left anterior descending.  There     was some ventricularization of the catheter within the left main     which was improved after nitroglycerin administration and removal     of the catheter.  Likely etiology for this was vasospasm.  The left     main artery in general does appear to be quite small in caliber.     Please see above for details. 4. Normal left ventricular ejection fraction of 55% with basal     inferior wall hypokinesis.  In review of prior angiogram from July     2010, there does not appear to be any significant change in the     diameter of the left main artery or in the other described     angiographic lesions.  Ejection fraction has improved.  Ejection     fraction  has improved from 40-45% up to now normal 55%.  PLAN:  If stable post radial artery catheterization, we will discharge. Troponins were negative as above.  Possible sources of left arm discomfort could be from a neuropathic pain.  He does have prior C-spine surgery.  Musculoskeletal etiology is possible.  We will see him back in 1 week for postcatheterization followup.  Jake Bathe, MD     MCS/MEDQ  D:  01/31/2011  T:  01/31/2011  Job:  [id number]  cc:   Armanda Magic, M.D.  Electronically Signed by Donato Schultz MD on 02/02/2011 06:15:16 AM

## 2011-02-02 NOTE — Discharge Summary (Addendum)
[name], [name] NO.:  [id number]  MEDICAL RECORD NO.:  [id number]  LOCATION:  [facility]LED                         FACILITY:  [facility]  PHYSICIAN:  Jake Bathe, MD      DATE OF BIRTH:  [birth year]  DATE OF ADMISSION:  01/30/2011 DATE OF DISCHARGE:  01/30/2011                              DISCHARGE SUMMARY   Discharge DIAGNOSES: 1. Unstable angina. 2. Coronary artery disease. 3. Status post previously placed stents in the right coronary artery     in 2010. 4. Hyperlipidemia. 5. Strong early family history of coronary artery disease with his     father dying of myocardial infarction at age 78. 50. Hypertension. 7. Chronic pain syndrome - currently on medication to help him with     opiate addiction. 8. Prior C-spine surgery.  HOSPITAL COURSE:  [name] presented to the [facility] Emergency Department with symptoms concerning for unstable angina with left arm pain down to his hand and chest discomfort that was similar to that of his prior myocardial infarction in 2010, which resulted in 2 bare-metal stent placements to the right coronary artery and ejection fraction of 45%.  I transported him from [facility] Emergency Department to [facility] and during transportation stated that he was having some discomfort and because of this, we decided to bring him to the catheterization lab. Cardiac markers were negative and EKG did not show any ischemic changes. In August 2011, he ended up having a nuclear stress test, which showed no evidence of ischemia and he had a nuclear stress test 1 year prior to that.  In the catheterization lab, we performed a successful radial artery diagnostic catheterization, which demonstrated patency of the previously placed right coronary artery stents, which were bare metal from June 2010.  He had moderate diffuse disease elsewhere including a small- caliber left main artery with calcification that demonstrated some ventricularization  and dampening, which was improved with nitroglycerin intracoronary.  His distal right coronary artery had approximately 40% narrowing.  His small-caliber obtuse marginal also had 60% to 70% narrowing and his mid left anterior descending artery had approximately 40% narrowing.  These lesions were unchanged from prior study.  Following the catheterization, his blood pressures were in the upper 90s to 100.  Fluid was administered and this was felt to be a combination of conscious sedation as well as nitroglycerin administered during the case.  Following adequate timing and observation, he was able to ambulate without difficulty, felt well enough to go home, and was discharged from the 2500 unit.  The nurse who discharged him was in communication with me and updated me on his status.  There was some discrepancy with his med reconciliation form and this was reconciled.  DISCHARGE MEDICATIONS: 1. Aspirin 81 mg once a day.  This was decreased from 325. 2. Plavix 75 mg once a day. 3. Metoprolol 25 mg twice a day. 4. Crestor 40 mg once a day. 5. Nitroglycerin p.r.n. 6. Gabapentin 600 mg twice a day. 7. Symbicort 1 puff twice a day.  NEW MEDICATION: 1. Imdur 30 mg once a day - this medication was added due to the     possibility of vasospasm  and as an antianginal. 2. He is also on a medication, which is being used to help him through     opiate withdrawal.  I do not have the name of this medication     currently, but this was reviewed with the nurse, and I am fine with     him continuing this medicine. 3. Because of his relative hypotension, I decided to hold his Altace 5     mg once a day, and his amlodipine 5 mg once a day as well.  He will     be seen back in the clinic in 1 week for postcatheterization     followup, and at that point if his blood pressures are elevated one     may wish to restart likely Altace first then amlodipine second. 4. I have also encouraged him to obtain close  followup with his     primary physician.  Lengthy discussion was had with his wife as     well.     Jake Bathe, MD     MCS/MEDQ  D:  01/31/2011  T:  01/31/2011  Job:  [id number]  cc:   Armanda Magic, M.D.  Electronically Signed by Donato Schultz MD on 02/02/2011 06:17:21 AM

## 2011-02-02 NOTE — H&P (Addendum)
[name], [name] NO.:  [id number]  MEDICAL RECORD NO.:  [id number]  LOCATION:  [facility]LED                         FACILITY:  [facility]  PHYSICIAN:  Jake Bathe, MD      DATE OF BIRTH:  1956-03-02  DATE OF ADMISSION:  01/30/2011 DATE OF DISCHARGE:  01/30/2011                             HISTORY & PHYSICAL   CARDIOLOGIST:  Armanda Magic, MD  CHIEF COMPLAINT:  Chest pain/left arm pain.  HISTORY OF PRESENT ILLNESS:  [name] is a 55 year old male with known coronary artery disease status post non ST elevation myocardial infarction in June 2010, requiring cardiac catheterization, showing an occluded RCA with successful bare-metal stent placement by Dr. Eldridge Dace, 2.5 x 12 Mini Vision stent with EF of 45-50% at that time as well as peripheral vascular disease of the right common femoral artery, here with chest pain and left arm pain, concerning for unstable angina. Recently over the past 2 weeks, he has been treated for bronchitis with prednisone.  He continues to smoke, although he states that he has decreased this.  He used to drink heavily but no longer drinks any alcohol.  His father died of MI at age 29.  Yesterday, he noted that he was beginning to have retrosternal chest discomfort as well as left arm discomfort mostly, left arm pain that was concerning to him.  He stated that prior to his last heart attack, he had left arm pain that went down to his hand and this was similar.  Because the pain was intermittent at rest and did not seem to have any relieving factors, did not seem to be associated with food per  se.  Because the pain was continuous, he went to the [facility] Emergency Department for further evaluation.  While there, both sets of troponins point of care were negative and his EKG showed sinus rhythm with no obvious ST-segment changes.  Prior EKG did demonstrate nonspecific T- wave change.  I asked Dr. Oletta Lamas of [facility]  to transfer him over to telemetry here at [facility] for further evaluation. During transport, he stated that he did have some chest discomfort and left arm pain.  Heparin was given at the [facility] ER.  PAST MEDICAL HISTORY:  Non-ST elevation myocardial infarction, RCA stent, prior obesity, hypertension, back surgery C5-C6 fusion in 2009, smoker, hypertension, hyperlipidemia.  FAMILY HISTORY:  Strong early family history of CAD with his father dying of MI at age 73.  SOCIAL HISTORY:  He continues to be a smoker.  No alcohol, no drug use, although he was a former heavy alcohol drinker.  Married.  ALLERGIES:  IBUPROFEN causes nausea, but no anaphylaxis.  REVIEW OF SYSTEMS:  Recent bronchitis episode.  Denies any fevers, chills, diarrhea, rashes, dysphasia, orthopnea, or significant change in shortness of breath.  Unless specified above, all other 12 review of systems negative.  MEDICATIONS AT HOME: 1. He is taking Crestor 40 mg. 2. Unsure if he is taking Altace 5 mg. 3. Plavix 75 mg. 4. Aspirin 325 mg. 5. Amlodipine 5 mg a day. 6. Neurontin 600 mg t.i.d. 7. Symbicort twice a day puffs. 8. Lopressor 25 b.i.d.  PHYSICAL EXAMINATION:  VITAL SIGNS:  Blood pressure currently 130/80, pulse in the 70s, respirations 16, satting 99% on room air, afebrile. GENERAL:  Alert and oriented x3, in no acute distress.  Mildly anxious, here with his wife. HEENT:  Eyes, well-perfused conjunctivae.  EOMI.  No scleral icterus. Normocephalic, atraumatic. NECK:  Supple.  No lymphadenopathy.  No thyromegaly.  No carotid bruits. No JVD. CARDIOVASCULAR:  Regular rate and rhythm without any appreciable murmurs, rubs, or gallops.  Normal PMI. LUNGS:  Clear to auscultation bilaterally.  I do not appreciate any active wheezing. ABDOMEN:  Soft, nontender, normoactive bowel sounds.  No hepatosplenomegaly. EXTREMITIES:  Soft bilateral femoral bruits are noted.  Peripheral pulses palpable but appeared  diminished pedal.  2+ radial pulse. GU:  Deferred. RECTAL:  Deferred. NEUROLOGIC:  Nonfocal.  Cranial nerves II-XII grossly intact.  DATA:  Creatinine 0.7, potassium 4.1.  INR is normal at 0.9.  White count 12.3, hemoglobin 14.1, platelet count 252.  Troponin point of care x2 are negative, 4 hours apart.  Chest x-ray shows interval minimal right basilar atelectasis.  This was personally reviewed.  Prior nuclear stress test in August, 2011, no ischemia, inferior wall defect, prior nuclear to that was in 2010.  ASSESSMENT AND PLAN:  A 55 year old male smoker, hypertension, hyperlipidemia, early family history of coronary artery disease with known coronary artery disease, prior right coronary artery stent, here with symptoms concerning for unstable angina.  1. Unstable angina - heparin drip had been initiated.  I will go ahead     and take him to the catheterization lab and perform procedure via     the right radial artery to further define his anatomy.  Dr.     Eldridge Dace is on standby in case intervention is required.  His prior     cardiac catheterization results were reviewed and demonstrated an     occluded right coronary artery which was opened with a bare-metal     stent.  He also had an left anterior descending coronary artery 60%     mid vessel lesion and the distal vessel was diffusely diseased.     His left circumflex had a 60% hazy proximal lesion, but this was a     small vessel.  Obtuse marginal 1 was branching with moderate     diffuse disease.  Ramus was large vessel with diffuse disease.  His     EF at that time showed 50% to 45% EF with inferobasal akinesis.  He     also was noted to have peripheral vascular disease at the right     common femoral artery with apparent right superficial femoral     artery along with the left common iliac artery.  He has not     described any claudication.  He continues to take his Plavix.     Risks and benefits of the catheterization have  been described to     him and his wife.  We will proceed. 2. Coronary artery disease - as described above.  Moderate diffuse     disease as well as prior right coronary artery stent. 3. Tobacco use - tobacco cessation discussed at length. 4. Recent bronchitis - seems to be resolved. 5. Hypertension - continue current medications.  I will look into the     possibility of him taking the Altace which was started because of     mild decreased ejection fraction. 6. Hyperlipidemia - continue Crestor.     Jake Bathe, MD  MCS/MEDQ  D:  01/30/2011  T:  01/31/2011  Job:  [id number]  cc:   Armanda Magic, M.D.  Electronically Signed by Donato Schultz MD on 02/02/2011 06:16:48 AM

## 2011-02-08 LAB — B-NATRIURETIC PEPTIDE (CONVERTED LAB): Pro B Natriuretic peptide (BNP): 77.6

## 2011-02-08 LAB — DIFFERENTIAL
Basophils Absolute: 0.3 — ABNORMAL HIGH
Eosinophils Relative: 1
Lymphocytes Relative: 10 — ABNORMAL LOW
Neutro Abs: 11.1 — ABNORMAL HIGH

## 2011-02-08 LAB — LIPID PANEL
Cholesterol: 231 — ABNORMAL HIGH
HDL: 34 — ABNORMAL LOW

## 2011-02-08 LAB — COMPREHENSIVE METABOLIC PANEL
ALT: 11
AST: 16
AST: 17
Alkaline Phosphatase: 78
BUN: 8
CO2: 24
CO2: 28
Calcium: 9
Chloride: 105
Chloride: 106
Creatinine, Ser: 0.71
GFR calc Af Amer: >60
GFR calc non Af Amer: >60
GFR calc non Af Amer: >60
Glucose, Bld: 107 — ABNORMAL HIGH
Potassium: 4.3
Sodium: 139
Total Bilirubin: 0.4
Total Bilirubin: 0.5

## 2011-02-08 LAB — CARDIAC PANEL(CRET KIN+CKTOT+MB+TROPI)
CK, MB: 1.6
CK, MB: 1.6
Relative Index: INVALID
Total CK: 61
Troponin I: 0.02

## 2011-02-08 LAB — D-DIMER, QUANTITATIVE: D-Dimer, Quant: 0.38

## 2011-02-08 LAB — CK TOTAL AND CKMB (NOT AT ARMC): CK, MB: 1.9

## 2011-02-08 LAB — CBC
HCT: 48.5
Hemoglobin: 14.3
Hemoglobin: 16.5
MCV: 90.2
Platelets: 293
RBC: 5.37
RDW: 14.6 — ABNORMAL HIGH
WBC: 14.1 — ABNORMAL HIGH

## 2011-02-08 LAB — RAPID URINE DRUG SCREEN, HOSP PERFORMED
Cocaine: NOT DETECTED
Opiates: POSITIVE — AB
Tetrahydrocannabinol: NOT DETECTED

## 2011-02-08 LAB — POCT CARDIAC MARKERS: CKMB, poc: 2.3

## 2011-03-09 ENCOUNTER — Emergency Department (HOSPITAL_COMMUNITY): Payer: Medicaid Other

## 2011-03-09 ENCOUNTER — Emergency Department (HOSPITAL_COMMUNITY)
Admission: EM | Admit: 2011-03-09 | Discharge: 2011-03-09 | Disposition: A | Discharge status: Home / Self Care | Payer: Medicaid Other | Attending: Emergency Medicine | Admitting: Emergency Medicine

## 2011-03-09 DIAGNOSIS — R51 Headache: Secondary | ICD-10-CM | POA: Insufficient documentation

## 2011-03-09 DIAGNOSIS — S0990XA Unspecified injury of head, initial encounter: Secondary | ICD-10-CM

## 2011-03-09 DIAGNOSIS — Z9889 Other specified postprocedural states: Secondary | ICD-10-CM | POA: Insufficient documentation

## 2011-03-09 DIAGNOSIS — S0101XA Laceration without foreign body of scalp, initial encounter: Secondary | ICD-10-CM

## 2011-03-09 DIAGNOSIS — Z79899 Other long term (current) drug therapy: Secondary | ICD-10-CM | POA: Insufficient documentation

## 2011-03-09 DIAGNOSIS — I2510 Atherosclerotic heart disease of native coronary artery without angina pectoris: Secondary | ICD-10-CM | POA: Insufficient documentation

## 2011-03-09 DIAGNOSIS — S0100XA Unspecified open wound of scalp, initial encounter: Secondary | ICD-10-CM | POA: Insufficient documentation

## 2011-03-09 DIAGNOSIS — I10 Essential (primary) hypertension: Secondary | ICD-10-CM | POA: Insufficient documentation

## 2011-03-09 HISTORY — DX: Atherosclerotic heart disease of native coronary artery without angina pectoris: I25.10

## 2011-03-09 HISTORY — DX: Essential (primary) hypertension: I10

## 2011-03-09 MED ORDER — OXYCODONE-ACETAMINOPHEN 5-325 MG PO TABS
1.0000 | ORAL_TABLET | ORAL | Status: AC
  Administered 2011-03-09: 1 via ORAL
  Filled 2011-03-09: qty 1

## 2011-03-09 MED ORDER — HYDROCODONE-ACETAMINOPHEN 5-325 MG PO TABS: 2.0000 | ORAL_TABLET | ORAL | Status: AC | PRN

## 2011-03-09 MED ORDER — HYDROMORPHONE HCL PF 1 MG/ML IJ SOLN
1.0000 mg | Freq: Once | INTRAMUSCULAR | Status: AC
  Administered 2011-03-09: 1 mg via INTRAMUSCULAR
  Filled 2011-03-09: qty 1

## 2011-03-09 NOTE — ED Provider Notes (Signed)
Medical screening examination/treatment/procedure(s) were performed by non-physician practitioner and as supervising physician I was immediately available for consultation/collaboration.   Christian R. Aibhlinn Kalmar, MD 03/09/11 2340 

## 2011-03-09 NOTE — ED Notes (Signed)
Pt st's he hit his head on door frame while getting into car.  Pt denies LOC.  Pt has lac to top of head

## 2011-03-09 NOTE — ED Provider Notes (Signed)
History     CSN: [id number] Arrival date & time: 03/09/2011  6:11 PM   First MD Initiated Contact with Patient 03/09/11 1905      Chief Complaint  Patient presents with  . Head Injury    (Consider location/radiation/quality/duration/timing/severity/associated sxs/prior treatment) Patient is a 55 y.o. male presenting with head injury. The history is provided by the patient.  Head Injury  The incident occurred 6 to 12 hours ago. He came to the ER via walk-in. The injury mechanism was a direct blow. There was no loss of consciousness. The volume of blood lost was minimal. The quality of the pain is described as sharp. The pain is at a severity of 5/10. The pain is moderate. The pain has been constant since the injury. Pertinent negatives include no numbness, no blurred vision, no vomiting, no disorientation, no weakness and no memory loss.  Pt states he hit the top of his head on a side of his car door. States has a laceration to the top of the head. Occurred about 6 hrs ago. Pt then went fishing, but states continued to have severe headache and unable to stop the bleeding. Pt reports he is taking aspirin and plavix. Was told by his PCP to come to ER. Denies LOC, denies blurred vision, nausea, vomiting, memory loss.   Past Medical History  Diagnosis Date  . Coronary artery disease   . Hypertension     Past Surgical History  Procedure Date  . Angioplasty / stenting femoral   . Cardiac catheterization     Family History  Problem Relation Age of Onset  . Heart failure Father   . Diabetes Father     History  Substance Use Topics  . Smoking status: Former Games developer  . Smokeless tobacco: Never Used  . Alcohol Use: No      Review of Systems  Constitutional: Negative.   HENT: Negative for nosebleeds and neck pain.   Eyes: Negative for blurred vision and visual disturbance.  Respiratory: Negative.   Cardiovascular: Negative.   Gastrointestinal: Negative.  Negative for vomiting.    Genitourinary: Negative.   Musculoskeletal: Negative for myalgias and back pain.  Neurological: Positive for headaches. Negative for dizziness, weakness, light-headedness and numbness.  Hematological: Negative.   Psychiatric/Behavioral: Negative.  Negative for memory loss.    Allergies  Nsaids  Home Medications   Current Outpatient Rx  Name Route Sig Dispense Refill  . ALBUTEROL SULFATE HFA 108 (90 BASE) MCG/ACT IN AERS Inhalation Inhale 2 puffs into the lungs every 4 (four) hours as needed. For wheezing     . VITAMIN C 1000 MG PO TABS Oral Take 1,000 mg by mouth daily.      . ASPIRIN 81 MG PO TABS Oral Take 81 mg by mouth daily.      . BUDESONIDE-FORMOTEROL FUMARATE 160-4.5 MCG/ACT IN AERO Inhalation Inhale 2 puffs into the lungs 2 (two) times daily.      Marland Kitchen BUPRENORPHINE HCL-NALOXONE HCL 8-2 MG SL SUBL Sublingual Place 1 tablet under the tongue 3 (three) times daily.      Marland Kitchen VITAMIN D 1000 UNITS PO TABS Oral Take 1,000 Units by mouth every evening.      Marland Kitchen CLOPIDOGREL BISULFATE 75 MG PO TABS Oral Take 75 mg by mouth daily.      Marland Kitchen GABAPENTIN 600 MG PO TABS Oral Take 600 mg by mouth 3 (three) times daily.      . ISOSORBIDE MONONITRATE ER 30 MG PO TB24 Oral Take 30 mg  by mouth daily.      Marland Kitchen METOPROLOL TARTRATE 50 MG PO TABS Oral Take 25 mg by mouth 2 (two) times daily.      Marland Kitchen NITROGLYCERIN 0.6 MG SL SUBL Sublingual Place 0.6 mg under the tongue every 5 (five) minutes as needed. For chest pain     . RANITIDINE HCL 75 MG PO TABS Oral Take 75 mg by mouth 2 (two) times daily.      Marland Kitchen ROSUVASTATIN CALCIUM 40 MG PO TABS Oral Take by mouth daily.      . TESTOSTERONE 25 MG/2.5GM TD GEL Transdermal Place 1 application onto the skin daily.        BP 132/70  Pulse 72  Temp(Src) 97.8 F (36.6 C) (Oral)  Resp 18  Ht 5' 7.5" (1.715 m)  Wt 165 lb (74.844 kg)  BMI 25.46 kg/m2  SpO2 95%  Physical Exam  Constitutional: He is oriented to person, place, and time. He appears well-developed and  well-nourished. No distress.  HENT:  Head: Normocephalic.       Laceration to the top of the head, 2cm, hemostatic, gaping  Eyes: Conjunctivae and EOM are normal. Pupils are equal, round, and reactive to light.  Neck: Normal range of motion. Neck supple.  Cardiovascular: Normal rate, regular rhythm and normal heart sounds.   Pulmonary/Chest: Effort normal and breath sounds normal. No respiratory distress.  Abdominal: Soft. Bowel sounds are normal.  Musculoskeletal: Normal range of motion.  Neurological: He is alert and oriented to person, place, and time. He has normal reflexes. No cranial nerve deficit. He exhibits normal muscle tone. Coordination normal.       Equal, 5/5 grip strength bilaterally. 5/5 and equal le strength bilaterally  Skin: Skin is warm and dry. No erythema.       See HENT exam  Psychiatric: He has a normal mood and affect.    ED Course  Procedures (including critical care time)  Pt on asa and plavix, having severe headache post trauma about 6 hrs ago. Normal neuro exam. Will get CT scan to r/o bleed.   LACERATION REPAIR Performed by: Lottie Mussel Authorized by: Jaynie Crumble A Consent: Verbal consent obtained. Risks and benefits: risks, benefits and alternatives were discussed Consent given by: patient Patient identity confirmed: provided demographic data Prepped and Draped in normal sterile fashion Wound explored  Laceration Location: midline top of the scalp  Laceration Length: 2cm  No Foreign Bodies seen or palpated  Anesthesia: none  Amount of cleaning: standard  Skin closure: surgical staples  Number of sutures: 2    Patient tolerance: Patient tolerated the procedure well with no immediate complications.  Ct Head Wo Contrast  03/09/2011  *RADIOLOGY REPORT*  Clinical Data: 55 year old male status post blunt trauma to the head with headaches.  CT HEAD WITHOUT CONTRAST  Technique:  Contiguous axial images were obtained from the  base of the skull through the vertex without contrast.  Comparison: None.  Findings: ACDF hardware evident on the scout view.  Minor paranasal sinus mucosal thickening.  Mastoids are clear. Visualized orbit soft tissues are within normal limits.  Skin staples at the vertex.  No scalp hematoma.  Underlying calvarium is intact.  Other scalp soft tissues appear within normal limits. No acute osseous abnormality identified.  Calcified atherosclerosis at the skull base.  Cerebral volume is within normal limits for age.  No midline shift, ventriculomegaly, mass effect, evidence of mass lesion, intracranial hemorrhage or evidence of cortically based acute infarction.  Gray-white  matter differentiation is within normal limits throughout the brain.  No suspicious intracranial vascular hyperdensity.  IMPRESSION: 1.  Scalp soft tissue injury at the vertex without underlying fracture. 2. Normal noncontrast CT appearance of the brain.  Original Report Authenticated By: Harley Hallmark, M.D.     8:46 PM CT head negative for an intracranial bleed or scull fracture. Laceration was repaired. Pain treated. Will d/c home.   MDM          Lottie Mussel, PA 03/09/11 2048

## 2011-03-09 NOTE — ED Notes (Signed)
Pt discharged without signing instructions due to computer problems.  Instructions given, pt and pt's wife voices understading

## 2012-05-05 ENCOUNTER — Emergency Department (HOSPITAL_COMMUNITY)
Admission: EM | Admit: 2012-05-05 | Discharge: 2012-05-05 | Disposition: A | Discharge status: Home / Self Care | Payer: Medicaid Other | Attending: Emergency Medicine | Admitting: Emergency Medicine

## 2012-05-05 DIAGNOSIS — S61209A Unspecified open wound of unspecified finger without damage to nail, initial encounter: Secondary | ICD-10-CM | POA: Insufficient documentation

## 2012-05-05 DIAGNOSIS — W269XXA Contact with unspecified sharp object(s), initial encounter: Secondary | ICD-10-CM | POA: Insufficient documentation

## 2012-05-05 DIAGNOSIS — Z87891 Personal history of nicotine dependence: Secondary | ICD-10-CM | POA: Insufficient documentation

## 2012-05-05 DIAGNOSIS — Z79899 Other long term (current) drug therapy: Secondary | ICD-10-CM | POA: Insufficient documentation

## 2012-05-05 DIAGNOSIS — S60459A Superficial foreign body of unspecified finger, initial encounter: Secondary | ICD-10-CM | POA: Insufficient documentation

## 2012-05-05 DIAGNOSIS — I10 Essential (primary) hypertension: Secondary | ICD-10-CM | POA: Insufficient documentation

## 2012-05-05 DIAGNOSIS — Z7982 Long term (current) use of aspirin: Secondary | ICD-10-CM | POA: Insufficient documentation

## 2012-05-05 DIAGNOSIS — I2510 Atherosclerotic heart disease of native coronary artery without angina pectoris: Secondary | ICD-10-CM | POA: Insufficient documentation

## 2012-05-05 DIAGNOSIS — Y939 Activity, unspecified: Secondary | ICD-10-CM | POA: Insufficient documentation

## 2012-05-05 DIAGNOSIS — S6990XA Unspecified injury of unspecified wrist, hand and finger(s), initial encounter: Secondary | ICD-10-CM

## 2012-05-05 DIAGNOSIS — Y929 Unspecified place or not applicable: Secondary | ICD-10-CM | POA: Insufficient documentation

## 2012-05-05 MED ORDER — OXYCODONE-ACETAMINOPHEN 5-325 MG PO TABS: 1.0000 | ORAL_TABLET | ORAL | Status: DC | PRN

## 2012-05-05 MED ORDER — OXYCODONE-ACETAMINOPHEN 5-325 MG PO TABS
1.0000 | ORAL_TABLET | Freq: Once | ORAL | Status: AC
  Administered 2012-05-05: 1 via ORAL
  Filled 2012-05-05: qty 1

## 2012-05-05 MED ORDER — LIDOCAINE HCL 2 % IJ SOLN: 10.0000 mL | Freq: Once | INTRAMUSCULAR | Status: DC

## 2012-05-05 NOTE — ED Provider Notes (Signed)
History   This chart was scribed for non-physician practitioner working with Remi Haggard, NP, by Magnus Sinning, ED Scribe. This patient was seen in room WTR8/WTR8 and the patient's care was started at 16:12.  CSN: [id number]  Arrival date & time 05/05/12  1604   No chief complaint on file.   (Consider location/radiation/quality/duration/timing/severity/associated sxs/prior treatment) HPI Comments: [name] is a 57 y.o. male who presents to the Emergency Department for removal of a fish hook caught to his left hand third digit. Per nurse, the patient states that his tetanus is UTD.The patient denies coumadin usage.   Patient is a 57 y.o. male presenting with skin laceration. The history is provided by the patient and the spouse. No language interpreter was used.  Laceration  The incident occurred 1 to 2 hours ago. The laceration is located on the left hand. The laceration mechanism was a a metal edge (fish hook). The pain is at a severity of 6/10. The pain is mild. The pain has been constant since onset. His tetanus status is UTD.    Past Medical History  Diagnosis Date  . Coronary artery disease   . Hypertension     Past Surgical History  Procedure Date  . Angioplasty / stenting femoral   . Cardiac catheterization     Family History  Problem Relation Age of Onset  . Heart failure Father   . Diabetes Father     History  Substance Use Topics  . Smoking status: Former Games developer  . Smokeless tobacco: Never Used  . Alcohol Use: No   Review of Systems  Gastrointestinal: Negative for nausea and diarrhea.  Skin:       Fish hook in finger  All other systems reviewed and are negative.    Allergies  Nsaids  Home Medications   Current Outpatient Rx  Name  Route  Sig  Dispense  Refill  . ALBUTEROL SULFATE HFA 108 (90 BASE) MCG/ACT IN AERS   Inhalation   Inhale 2 puffs into the lungs every 4 (four) hours as needed. For wheezing          . VITAMIN C 1000 MG PO  TABS   Oral   Take 1,000 mg by mouth daily.           . ASPIRIN 81 MG PO TABS   Oral   Take 81 mg by mouth daily.           . BUDESONIDE-FORMOTEROL FUMARATE 160-4.5 MCG/ACT IN AERO   Inhalation   Inhale 2 puffs into the lungs 2 (two) times daily.           Marland Kitchen BUPRENORPHINE HCL-NALOXONE HCL 8-2 MG SL SUBL   Sublingual   Place 1 tablet under the tongue 3 (three) times daily.           Marland Kitchen VITAMIN D 1000 UNITS PO TABS   Oral   Take 1,000 Units by mouth every evening.           Marland Kitchen CLOPIDOGREL BISULFATE 75 MG PO TABS   Oral   Take 75 mg by mouth daily.           Marland Kitchen GABAPENTIN 600 MG PO TABS   Oral   Take 600 mg by mouth 3 (three) times daily.           . ISOSORBIDE MONONITRATE ER 30 MG PO TB24   Oral   Take 30 mg by mouth daily.           Marland Kitchen  METOPROLOL TARTRATE 50 MG PO TABS   Oral   Take 25 mg by mouth 2 (two) times daily.           Marland Kitchen NITROGLYCERIN 0.6 MG SL SUBL   Sublingual   Place 0.6 mg under the tongue every 5 (five) minutes as needed. For chest pain          . RANITIDINE HCL 75 MG PO TABS   Oral   Take 75 mg by mouth 2 (two) times daily.           Marland Kitchen ROSUVASTATIN CALCIUM 40 MG PO TABS   Oral   Take by mouth daily.           . TESTOSTERONE 25 MG/2.5GM TD GEL   Transdermal   Place 1 application onto the skin daily.             BP 109/92  Pulse 78  Temp 98.1 F (36.7 C) (Oral)  Resp 16  SpO2 96%  Physical Exam  Nursing note and vitals reviewed. Constitutional: He is oriented to person, place, and time. He appears well-developed and well-nourished. No distress.  HENT:  Head: Normocephalic and atraumatic.  Eyes: Conjunctivae normal and EOM are normal.  Neck: Neck supple. No tracheal deviation present.  Cardiovascular: Normal rate.   Pulmonary/Chest: Effort normal. No respiratory distress.  Abdominal: He exhibits no distension.  Musculoskeletal: Normal range of motion.  Neurological: He is alert and oriented to person, place, and  time. No sensory deficit.  Skin: Skin is dry.       Fish hook in L 3rd finger  Psychiatric: He has a normal mood and affect. His behavior is normal.    ED Course  Irrigation Date/Time: 05/05/2012 4:59 PM Performed by: Remi Haggard Authorized by: Remi Haggard Consent: Verbal consent obtained. Written consent not obtained. Risks and benefits: risks, benefits and alternatives were discussed Consent given by: patient Patient understanding: patient states understanding of the procedure being performed Patient identity confirmed: verbally with patient, arm band, provided demographic data and hospital-assigned identification number Preparation: Patient was prepped and draped in the usual sterile fashion. Local anesthesia used: yes Anesthesia: digital block Local anesthetic: lidocaine 2% without epinephrine Patient sedated: no Patient tolerance: Patient tolerated the procedure well with no immediate complications.  NERVE BLOCK Date/Time: 05/05/2012 5:00 PM Performed by: Remi Haggard Authorized by: Remi Haggard Consent: Verbal consent obtained. Risks and benefits: risks, benefits and alternatives were discussed Consent given by: patient Patient understanding: patient states understanding of the procedure being performed Patient identity confirmed: verbally with patient, arm band, provided demographic data and hospital-assigned identification number Time out: Immediately prior to procedure a "time out" was called to verify the correct patient, procedure, equipment, support staff and site/side marked as required. Indications: pain relief Body area: upper extremity (L 3rd finger) Preparation: Patient was prepped and draped in the usual sterile fashion. Local anesthetic: lidocaine 2% without epinephrine Outcome: pain improved Patient tolerance: Patient tolerated the procedure well with no immediate complications.   (including critical care time)  DIAGNOSTIC STUDIES: Oxygen  Saturation is 96% on room air, normal by my interpretation.    COORDINATION OF CARE: 16:21: Lidocaine injection administered. 16:26: Patient notes he is able to feel fish hook removal. Additional lidocaine administered.  14:37:Dr. Jeraldine Loots assisted in removal of small fish hook from the palm side left third finger tip. Wound care provided.   14:44: Pt provided with instructions to to cleaning with soap and water for next seven days  Labs  Reviewed - No data to display   No results found.   No diagnosis found.    MDM  Fish hook to L 3rd finger removed in the ER today.  Tetanus up to date.  He understands to return for severe pain or infection.  Will follow up with pcp as needed.     I personally performed the services described in this documentation, which was scribed in my presence. The recorded information has been reviewed and is accurate.        Remi Haggard, NP 05/05/12 1701  The patient presents with retained foreign body, a fish hook in his left third digit.  Following provision of anesthetic, the nurse practitioner and I together performed appropriate surgical removal of the fish hook, subsequently cleaned and dressed the wound.  The patient was discharged in stable condition.  Gerhard Munch, MD 05/06/12 [phone]

## 2012-05-05 NOTE — ED Notes (Signed)
Pt has fish hook in 3rd finger on L hand. Pt states his tetanus is up to date. Bleeding controlled.

## 2013-01-25 ENCOUNTER — Encounter: Payer: Self-pay | Admitting: Cardiology

## 2013-01-25 ENCOUNTER — Encounter: Payer: Self-pay | Admitting: *Deleted

## 2013-01-30 ENCOUNTER — Ambulatory Visit (INDEPENDENT_AMBULATORY_CARE_PROVIDER_SITE_OTHER): Payer: Medicaid Other | Admitting: Cardiology

## 2013-01-30 ENCOUNTER — Encounter: Payer: Self-pay | Admitting: Cardiology

## 2013-01-30 VITALS — BP 120/79 | HR 72 | Ht 67.5 in | Wt 149.0 lb

## 2013-01-30 DIAGNOSIS — I10 Essential (primary) hypertension: Secondary | ICD-10-CM

## 2013-01-30 DIAGNOSIS — E7800 Pure hypercholesterolemia, unspecified: Secondary | ICD-10-CM

## 2013-01-30 DIAGNOSIS — I2510 Atherosclerotic heart disease of native coronary artery without angina pectoris: Secondary | ICD-10-CM

## 2013-01-30 NOTE — Progress Notes (Signed)
[address] [place], [place]  [address] Phone: [phone] Fax:  [phone]  Date:  01/30/2013   ID:  [name], [name] 10/09/1955, MRN [id number]  PCP:  Julieanne Manson, MD  Cardiologist:  Armanda Magic, MD     History of Present Illness: [name] is a 57 y.o. male with a history of CAD, HTN, dyslipidemia and PVD who presents for followup.  He is doing well.  He denies any chest pain, SOB, DOE, LE edema, dizziness, palpitations or syncope.  He walks for exercise.   Wt Readings from Last 3 Encounters:  01/30/13 149 lb (67.586 kg)  03/09/11 165 lb (74.844 kg)  04/29/10 152 lb 4 oz (69.06 kg)     Past Medical History  Diagnosis Date  . Hypertension   . HYPERCHOLESTEROLEMIA   . MI   . PERIPHERAL VASCULAR DISEASE WITH CLAUDICATION, LEFT LEG   . THYROMEGALY   . ANXIETY   . DEPRESSION   . CARPAL TUNNEL SYNDROME, RIGHT   . REACTIVE AIRWAY DISEASE   . COPD   . LUNG NODULES   . DEGENERATIVE DISC DISEASE, CERVICAL SPINE, W/RADICULOPATHY   . ROTATOR CUFF SYNDROME, LEFT   . GANGLION CYST, WRIST, LEFT   . PLANTAR FASCIITIS, LEFT   . HYPERGLYCEMIA   . TESTICULAR HYPOFUNCTION   . Coronary artery disease     s/p bare metal stens X 2 to the RCA, 09/28/2008 EF45-50%.  ath 01/2011 with patent RCA stents and moderate disese elsewhere    Current Outpatient Prescriptions  Medication Sig Dispense Refill  . albuterol (PROVENTIL HFA;VENTOLIN HFA) 108 (90 BASE) MCG/ACT inhaler Inhale 2 puffs into the lungs every 4 (four) hours as needed. For wheezing       . Ascorbic Acid (VITAMIN C) 1000 MG tablet Take 1,000 mg by mouth daily.        Marland Kitchen aspirin 81 MG tablet Take 81 mg by mouth daily.        Marland Kitchen atorvastatin (LIPITOR) 80 MG tablet Take 80 mg by mouth daily.      . buprenorphine-naloxone (SUBOXONE) 8-2 MG SUBL Place 1 tablet under the tongue 3 (three) times daily.        . cholecalciferol (VITAMIN D) 1000 UNITS tablet Take 1,000 Units by mouth every evening.          . clopidogrel (PLAVIX) 75 MG tablet Take 75 mg by mouth daily.        Marland Kitchen gabapentin (NEURONTIN) 600 MG tablet Take 600 mg by mouth 3 (three) times daily.        . isosorbide mononitrate (IMDUR) 30 MG 24 hr tablet Take 30 mg by mouth daily.        . metoprolol (LOPRESSOR) 50 MG tablet Take 25 mg by mouth 2 (two) times daily.        . nitroGLYCERIN (NITROSTAT) 0.6 MG SL tablet Place 0.6 mg under the tongue every 5 (five) minutes as needed. For chest pain       . Omega-3 Fatty Acids (FISH OIL) 1000 MG CAPS Take 1,000 mg by mouth 2 (two) times daily.      Marland Kitchen oxyCODONE-acetaminophen (PERCOCET/ROXICET) 5-325 MG per tablet Take 1 tablet by mouth every 4 (four) hours as needed for pain.  6 tablet  0  . ranitidine (ZANTAC) 75 MG tablet Take 75 mg by mouth daily.       No current facility-administered medications for this visit.    Allergies:    Allergies  Allergen  Reactions  . Nsaids     REACTION: Not a true allergy--GI upset with some.    Social History:  The patient  reports that he has quit smoking. He has never used smokeless tobacco. He reports that he does not drink alcohol or use illicit drugs.   Family History:  The patient's family history includes Diabetes in his father; Heart failure in his father.   ROS:  Please see the history of present illness.      All other systems reviewed and negative.   PHYSICAL EXAM: VS:  BP 120/79  Pulse 72  Ht 5' 7.5" (1.715 m)  Wt 149 lb (67.586 kg)  BMI 22.98 kg/m2 Well nourished, well developed, in no acute distress HEENT: normal Neck: no JVD Cardiac:  normal S1, S2; RRR; no murmur Lungs:  clear to auscultation bilaterally, no wheezing, rhonchi or rales Abd: soft, nontender, no hepatomegaly Ext: no edema Skin: warm and dry Neuro:  CNs 2-12 intact, no focal abnormalities noted       ASSESSMENT AND PLAN:  1. CAD stable with no angina  - continue ASA/Plavix/Imdur 2.  HTN  - continue Metoprolol 3.  Dyslipidemia  - continue fish  oil/atorvastatin  - He is due to get his lipids checked in November  Followup with me in 6 months  Signed, Armanda Magic, MD 01/30/2013 1:50 PM

## 2013-01-30 NOTE — Patient Instructions (Signed)
Your physician recommends that you continue on your current medications as directed. Please refer to the Current Medication list given to you today.  Your physician recommends that you schedule a follow-up appointment in: 6 months with Dr. Turner.  

## 2013-02-10 ENCOUNTER — Other Ambulatory Visit: Payer: Self-pay | Admitting: *Deleted

## 2013-02-10 DIAGNOSIS — Z79899 Other long term (current) drug therapy: Secondary | ICD-10-CM

## 2013-02-10 DIAGNOSIS — E7800 Pure hypercholesterolemia, unspecified: Secondary | ICD-10-CM

## 2013-03-17 ENCOUNTER — Other Ambulatory Visit: Payer: Self-pay | Admitting: Cardiology

## 2013-03-17 ENCOUNTER — Other Ambulatory Visit: Payer: Medicaid Other

## 2013-03-19 ENCOUNTER — Other Ambulatory Visit: Payer: Self-pay

## 2013-03-19 MED ORDER — ISOSORBIDE MONONITRATE ER 30 MG PO TB24: 30.0000 mg | ORAL_TABLET | Freq: Every day | ORAL | Status: DC

## 2013-05-14 ENCOUNTER — Encounter (HOSPITAL_COMMUNITY): Payer: Self-pay | Admitting: Emergency Medicine

## 2013-05-14 ENCOUNTER — Emergency Department (HOSPITAL_COMMUNITY): Payer: Medicaid Other

## 2013-05-14 ENCOUNTER — Emergency Department (HOSPITAL_COMMUNITY)
Admission: EM | Admit: 2013-05-14 | Discharge: 2013-05-14 | Disposition: A | Discharge status: Home / Self Care | Payer: Medicaid Other | Attending: Emergency Medicine | Admitting: Emergency Medicine

## 2013-05-14 DIAGNOSIS — Z7982 Long term (current) use of aspirin: Secondary | ICD-10-CM | POA: Insufficient documentation

## 2013-05-14 DIAGNOSIS — S61209A Unspecified open wound of unspecified finger without damage to nail, initial encounter: Secondary | ICD-10-CM | POA: Insufficient documentation

## 2013-05-14 DIAGNOSIS — J4489 Other specified chronic obstructive pulmonary disease: Secondary | ICD-10-CM | POA: Insufficient documentation

## 2013-05-14 DIAGNOSIS — J449 Chronic obstructive pulmonary disease, unspecified: Secondary | ICD-10-CM | POA: Insufficient documentation

## 2013-05-14 DIAGNOSIS — F411 Generalized anxiety disorder: Secondary | ICD-10-CM | POA: Insufficient documentation

## 2013-05-14 DIAGNOSIS — Y9389 Activity, other specified: Secondary | ICD-10-CM | POA: Insufficient documentation

## 2013-05-14 DIAGNOSIS — W268XXA Contact with other sharp object(s), not elsewhere classified, initial encounter: Secondary | ICD-10-CM | POA: Insufficient documentation

## 2013-05-14 DIAGNOSIS — F329 Major depressive disorder, single episode, unspecified: Secondary | ICD-10-CM | POA: Insufficient documentation

## 2013-05-14 DIAGNOSIS — Z7902 Long term (current) use of antithrombotics/antiplatelets: Secondary | ICD-10-CM | POA: Insufficient documentation

## 2013-05-14 DIAGNOSIS — Z8669 Personal history of other diseases of the nervous system and sense organs: Secondary | ICD-10-CM | POA: Insufficient documentation

## 2013-05-14 DIAGNOSIS — Y929 Unspecified place or not applicable: Secondary | ICD-10-CM | POA: Insufficient documentation

## 2013-05-14 DIAGNOSIS — I252 Old myocardial infarction: Secondary | ICD-10-CM | POA: Insufficient documentation

## 2013-05-14 DIAGNOSIS — F3289 Other specified depressive episodes: Secondary | ICD-10-CM | POA: Insufficient documentation

## 2013-05-14 DIAGNOSIS — E7800 Pure hypercholesterolemia, unspecified: Secondary | ICD-10-CM | POA: Insufficient documentation

## 2013-05-14 DIAGNOSIS — Z79899 Other long term (current) drug therapy: Secondary | ICD-10-CM | POA: Insufficient documentation

## 2013-05-14 DIAGNOSIS — Z95818 Presence of other cardiac implants and grafts: Secondary | ICD-10-CM | POA: Insufficient documentation

## 2013-05-14 DIAGNOSIS — Z87891 Personal history of nicotine dependence: Secondary | ICD-10-CM | POA: Insufficient documentation

## 2013-05-14 DIAGNOSIS — Z8739 Personal history of other diseases of the musculoskeletal system and connective tissue: Secondary | ICD-10-CM | POA: Insufficient documentation

## 2013-05-14 DIAGNOSIS — Z792 Long term (current) use of antibiotics: Secondary | ICD-10-CM | POA: Insufficient documentation

## 2013-05-14 DIAGNOSIS — I2510 Atherosclerotic heart disease of native coronary artery without angina pectoris: Secondary | ICD-10-CM | POA: Insufficient documentation

## 2013-05-14 DIAGNOSIS — I10 Essential (primary) hypertension: Secondary | ICD-10-CM | POA: Insufficient documentation

## 2013-05-14 DIAGNOSIS — Z9861 Coronary angioplasty status: Secondary | ICD-10-CM | POA: Insufficient documentation

## 2013-05-14 DIAGNOSIS — S6991XA Unspecified injury of right wrist, hand and finger(s), initial encounter: Secondary | ICD-10-CM

## 2013-05-14 MED ORDER — CEPHALEXIN 500 MG PO CAPS: 500.0000 mg | ORAL_CAPSULE | Freq: Four times a day (QID) | ORAL | Status: DC

## 2013-05-14 NOTE — Discharge Instructions (Signed)
Fish Hook Removal  A fish hook can cause a small cut or lesion that extends through all layers of the skin and into the subcutaneous tissue. Because of this, bacteria may get injected beneath the surface of the skin. A simple bandage (dressing) may be applied. This should be changed daily. Follow your caregiver's instructions regarding use of any antibacterial ointments.   Only take over-the-counter or prescription medicines for pain, discomfort, or fever as directed by your caregiver.  If you did not receive a tetanus shot from your caregiver because you did not recall when your last one was given, make sure to check with your physician's office and determine if one is needed. Generally for a "dirty" wound, you should receive a tetanus booster if you have not had one in the last five years. If you have a "clean" wound, you should receive a tetanus booster if you have not had one within the last ten years.  SEEK IMMEDIATE MEDICAL CARE IF:    You develop redness, swelling, or increasing pain in the wound.   You have a fever.   You notice a bad smell coming from the wound or dressing.   You notice pus or other unusual drainage coming from the wound.  Document Released: 04/07/2000 Document Revised: 07/03/2011 Document Reviewed: 10/29/2008  [employer] Patient Information 2014 [employer]

## 2013-05-14 NOTE — ED Notes (Signed)
Pt states that he got a fish hook in his hand about 2 hours ago.

## 2013-05-14 NOTE — ED Provider Notes (Signed)
CSN: [id number]     Arrival date & time 05/14/13  1809 History   First MD Initiated Contact with Patient 05/14/13 1929     This chart was scribed for Antony Madura, non-physician practitioner, working with Juliet Rude. Rubin Payor, MD, by Ellin Mayhew, ED Scribe. This patient was seen in room WTR7/WTR7 and the patient's care was started at 8:27 PM.  Chief Complaint  Patient presents with  . Fish Hook In [employer]    (Consider location/radiation/quality/duration/timing/severity/associated sxs/prior Treatment) Patient is a 58 y.o. male presenting with hand injury. The history is provided by the patient. No language interpreter was used.  Hand Injury Location:  Finger Time since incident:  4 hours Injury: yes   Mechanism of injury: stab wound   Stab injury:    Number of wounds:  1   Blade type:  Single-edged   Edge type:  Smooth   Inflicted by:  Other   Suspected intent:  Accidental Finger location:  R index finger Pain details:    Quality:  Throbbing   Radiates to:  Does not radiate   Severity:  Mild   Onset quality:  Sudden   Duration:  4 hours   Timing:  Constant   Progression:  Unchanged Chronicity:  New Dislocation: no    HPI Comments: [name] is a 58 y.o. male who presents to the Emergency Department after getting a fish hook caught in his R index finger. Patient was fishing approximately four hours ago today and while handling the fish, accidentally was hooked. The patient confirms that the hook was already in the fishes mouth prior to having it make contact with his hand. Patient has a history of HTN and Carpal Tunnel Syndrome.  Past Medical History  Diagnosis Date  . Hypertension   . HYPERCHOLESTEROLEMIA   . MI   . PERIPHERAL VASCULAR DISEASE WITH CLAUDICATION, LEFT LEG   . THYROMEGALY   . ANXIETY   . DEPRESSION   . CARPAL TUNNEL SYNDROME, RIGHT   . REACTIVE AIRWAY DISEASE   . COPD   . LUNG NODULES   . DEGENERATIVE DISC DISEASE, CERVICAL SPINE, W/RADICULOPATHY    . ROTATOR CUFF SYNDROME, LEFT   . GANGLION CYST, WRIST, LEFT   . PLANTAR FASCIITIS, LEFT   . HYPERGLYCEMIA   . TESTICULAR HYPOFUNCTION   . Coronary artery disease     s/p bare metal stens X 2 to the RCA, 09/28/2008 EF45-50%.  ath 01/2011 with patent RCA stents and moderate disese elsewhere   Past Surgical History  Procedure Laterality Date  . Angioplasty / stenting femoral    . Cardiac catheterization      01/2011 cardiac cath with patent stents and normal EF   Family History  Problem Relation Age of Onset  . Heart failure Father   . Diabetes Father    History  Substance Use Topics  . Smoking status: Former Games developer  . Smokeless tobacco: Never Used  . Alcohol Use: No    Review of Systems  Musculoskeletal: Positive for joint swelling.  Skin: Positive for color change.       Fish hook to R hand.  All other systems reviewed and are negative.   Allergies  Nsaids  Home Medications   Current Outpatient Rx  Name  Route  Sig  Dispense  Refill  . albuterol (PROVENTIL HFA;VENTOLIN HFA) 108 (90 BASE) MCG/ACT inhaler   Inhalation   Inhale 2 puffs into the lungs every 4 (four) hours as needed for wheezing or shortness of  breath. For wheezing         . amphetamine-dextroamphetamine (ADDERALL) 15 MG tablet   Oral   Take 15 mg by mouth daily.         . Ascorbic Acid (VITAMIN C) 1000 MG tablet   Oral   Take 1,000 mg by mouth daily.           Marland Kitchen. aspirin 81 MG tablet   Oral   Take 81 mg by mouth daily.           Marland Kitchen. atorvastatin (LIPITOR) 80 MG tablet   Oral   Take 80 mg by mouth daily.         . buprenorphine-naloxone (SUBOXONE) 8-2 MG SUBL   Sublingual   Place 1 tablet under the tongue 3 (three) times daily.           . cholecalciferol (VITAMIN D) 1000 UNITS tablet   Oral   Take 1,000 Units by mouth every evening.           . clopidogrel (PLAVIX) 75 MG tablet      TAKE ONE TABLET BY MOUTH ONCE DAILY   90 tablet   1   . gabapentin (NEURONTIN) 600 MG  tablet   Oral   Take 600 mg by mouth 3 (three) times daily.           . isosorbide mononitrate (IMDUR) 30 MG 24 hr tablet   Oral   Take 1 tablet (30 mg total) by mouth daily.   30 tablet   6   . metoprolol (LOPRESSOR) 50 MG tablet   Oral   Take 25 mg by mouth 2 (two) times daily.           . nitroGLYCERIN (NITROSTAT) 0.6 MG SL tablet   Sublingual   Place 0.6 mg under the tongue every 5 (five) minutes as needed. For chest pain          . ranitidine (ZANTAC) 75 MG tablet   Oral   Take 75 mg by mouth daily as needed for heartburn.          . cephALEXin (KEFLEX) 500 MG capsule   Oral   Take 1 capsule (500 mg total) by mouth 4 (four) times daily.   28 capsule   0    BP 122/67  Pulse 70  Temp(Src) 98 F (36.7 C) (Oral)  Resp 16  SpO2 99% Physical Exam  Nursing note and vitals reviewed. Constitutional: He is oriented to person, place, and time. He appears well-developed and well-nourished. No distress.  HENT:  Head: Normocephalic and atraumatic.  Eyes: Conjunctivae and EOM are normal. No scleral icterus.  Neck: Normal range of motion.  Cardiovascular: Normal rate, regular rhythm and intact distal pulses.   Capillary refill normal. Distal radial pulse 2+ in right hand.  Pulmonary/Chest: Effort normal. No respiratory distress.  Musculoskeletal: Normal range of motion.  +fish hook in R index finger. No active bleeding. Normal ROM of R index finger.  Neurological: He is alert and oriented to person, place, and time.  No gross sensory deficits appreciated.  Skin: Skin is warm and dry. No rash noted. He is not diaphoretic. No erythema. No pallor.  Psychiatric: He has a normal mood and affect. His behavior is normal.    ED Course  FOREIGN BODY REMOVAL Date/Time: 05/14/2013 8:28 PM Performed by: Antony MaduraHUMES, Neev Mcmains Authorized by: Antony MaduraHUMES, Adaliah Hiegel Consent: Verbal consent obtained. written consent not obtained. The procedure was performed in an emergent situation. Risks and  benefits: risks, benefits and alternatives were discussed Consent given by: patient Patient understanding: patient states understanding of the procedure being performed Patient consent: the patient's understanding of the procedure matches consent given Procedure consent: procedure consent matches procedure scheduled Relevant documents: relevant documents present and verified Test results: test results available and properly labeled Site marked: the operative site was marked Imaging studies: imaging studies available Required items: required blood products, implants, devices, and special equipment available Patient identity confirmed: verbally with patient and arm band Time out: Immediately prior to procedure a "time out" was called to verify the correct patient, procedure, equipment, support staff and site/side marked as required. Intake: R index finger. Anesthesia: local infiltration Local anesthetic: lidocaine 2% without epinephrine Anesthetic total: 2 ml Patient sedated: no Patient restrained: no Patient cooperative: yes Complexity: simple 1 objects recovered. Objects recovered: fish hook Post-procedure assessment: foreign body removed Patient tolerance: Patient tolerated the procedure well with no immediate complications. Comments: Wound irrigated with 2cc of remaining 2% lidocaine without epi. Patient tolerated well without discomfort or immediate complications.   (including critical care time)  DIAGNOSTIC STUDIES: Oxygen Saturation is 99% on RA, normal by my interpretation.    COORDINATION OF CARE: 8:17 PM-Treatment plan to remove fish hook discussed with patient and patient agrees.  8:20 PM-Applied local blocking to R index finger near the wound. Removed fish hook manually. Patient tolerated treatment well without pain.  Labs Review Labs Reviewed - No data to display Imaging Review Dg Finger Index Right  05/14/2013   CLINICAL DATA:  Doreatha Martin out and right index finger.   EXAM: RIGHT INDEX FINGER 2+V  COMPARISON:  None.  FINDINGS: A fish hook is embedded within the soft tissues of the proximal right index finger. The fish with has been cut near the skin surface, but is otherwise intact.  No fracture.  No other foreign body.  The joints normally aligned.  IMPRESSION: Fish hook in the proximal right index finger soft tissues. No fracture.   Electronically Signed   By: Amie Portland M.D.   On: 05/14/2013 19:58    EKG Interpretation   None       MDM   1. Fish hook injury of finger of right hand    Uncomplicated fishhook removal. Patient neurovascularly intact. No gross sensory deficits appreciated. Tetanus UTD. Right index finger numbed with local infiltration and foreign body removed. Patient tolerated well with no immediate complications. He is stable for discharge with seven-day course of Keflex. Return precautions provided and patient agreeable to plan with no unaddressed concerns.  I personally performed the services described in this documentation, which was scribed in my presence. The recorded information has been reviewed and is accurate.     Antony Madura, PA-C 05/14/13 2031

## 2013-05-15 NOTE — ED Provider Notes (Signed)
Medical screening examination/treatment/procedure(s) were conducted as a shared visit with non-physician practitioner(s) and myself.  I personally evaluated the patient during the encounter.  EKG Interpretation   None      Patient with fishhook in finger. Removed  Juliet RudeNathan R. Rubin PayorPickering, MD 05/15/13 [id number]

## 2013-06-04 ENCOUNTER — Other Ambulatory Visit: Payer: Self-pay

## 2013-06-04 MED ORDER — ATORVASTATIN CALCIUM 80 MG PO TABS: 80.0000 mg | ORAL_TABLET | Freq: Every day | ORAL | Status: DC

## 2013-06-26 ENCOUNTER — Other Ambulatory Visit: Payer: Self-pay | Admitting: Cardiology

## 2013-06-27 NOTE — Telephone Encounter (Signed)
Should this be 0.6mg , which is on the chart, or 0.4mg  that was requested? Please advise. Thanks, MI

## 2013-07-29 ENCOUNTER — Ambulatory Visit (INDEPENDENT_AMBULATORY_CARE_PROVIDER_SITE_OTHER): Payer: Medicaid Other | Admitting: Cardiology

## 2013-07-29 ENCOUNTER — Encounter: Payer: Self-pay | Admitting: Cardiology

## 2013-07-29 VITALS — BP 104/56 | HR 64 | Ht 67.0 in | Wt 147.0 lb

## 2013-07-29 DIAGNOSIS — I2510 Atherosclerotic heart disease of native coronary artery without angina pectoris: Secondary | ICD-10-CM

## 2013-07-29 DIAGNOSIS — E7800 Pure hypercholesterolemia, unspecified: Secondary | ICD-10-CM

## 2013-07-29 DIAGNOSIS — I10 Essential (primary) hypertension: Secondary | ICD-10-CM

## 2013-07-29 NOTE — Patient Instructions (Signed)
Your physician recommends that you continue on your current medications as directed. Please refer to the Current Medication list given to you today.  Your physician recommends that you return for lab work first available for fasting Lipid and ALT. Please schedule at check out today before leaving   Your physician wants you to follow-up in: 6 months with Dr Sherlyn Lickurner You will receive a reminder letter in the mail two months in advance. If you don't receive a letter, please call our office to schedule the follow-up appointment.

## 2013-07-29 NOTE — Progress Notes (Signed)
[address] [place], [place]  [address] Phone: [phone] Fax:  [phone]  Date:  07/29/2013   ID:  [name], DOB 06/05/1955, MRN [id number]  PCP:  Julieanne MansonMULBERRY,ELIZABETH, MD  Cardiologist:  Armanda Magicraci Turner, MD     History of Present Illness:[name] is a 58 y.o. male with a history of CAD, HTN, dyslipidemia and PVD who presents for followup. He is doing well. He denies any chest pain, SOB, DOE, LE edema, dizziness, palpitations or syncope. He walks for exercise.     Wt Readings from Last 3 Encounters:  07/29/13 147 lb (66.679 kg)  01/30/13 149 lb (67.586 kg)  03/09/11 165 lb (74.844 kg)     Past Medical History  Diagnosis Date  . Hypertension   . HYPERCHOLESTEROLEMIA   . MI   . PERIPHERAL VASCULAR DISEASE WITH CLAUDICATION, LEFT LEG   . THYROMEGALY   . ANXIETY   . DEPRESSION   . CARPAL TUNNEL SYNDROME, RIGHT   . REACTIVE AIRWAY DISEASE   . COPD   . LUNG NODULES   . DEGENERATIVE DISC DISEASE, CERVICAL SPINE, W/RADICULOPATHY   . ROTATOR CUFF SYNDROME, LEFT   . GANGLION CYST, WRIST, LEFT   . PLANTAR FASCIITIS, LEFT   . HYPERGLYCEMIA   . TESTICULAR HYPOFUNCTION   . Coronary artery disease     s/p bare metal stens X 2 to the RCA, 09/28/2008 EF45-50%.  ath 01/2011 with patent RCA stents and moderate disese elsewhere    Current Outpatient Prescriptions  Medication Sig Dispense Refill  . amphetamine-dextroamphetamine (ADDERALL) 15 MG tablet Take 15 mg by mouth daily.      . Ascorbic Acid (VITAMIN C) 1000 MG tablet Take 1,000 mg by mouth daily.        Marland Kitchen. aspirin 81 MG tablet Take 81 mg by mouth daily.        Marland Kitchen. atorvastatin (LIPITOR) 80 MG tablet Take 1 tablet (80 mg total) by mouth daily.  90 tablet  3  . buprenorphine-naloxone (SUBOXONE) 8-2 MG SUBL Place 1 tablet under the tongue 3 (three) times daily.        . cholecalciferol (VITAMIN D) 1000 UNITS tablet Take 1,000 Units by mouth every evening.        . clopidogrel (PLAVIX) 75 MG tablet TAKE  ONE TABLET BY MOUTH ONCE DAILY  90 tablet  1  . gabapentin (NEURONTIN) 600 MG tablet Take 600 mg by mouth 3 (three) times daily.        . isosorbide mononitrate (IMDUR) 30 MG 24 hr tablet Take 1 tablet (30 mg total) by mouth daily.  30 tablet  6  . metoprolol (LOPRESSOR) 50 MG tablet Take 25 mg by mouth 2 (two) times daily.        . nitroGLYCERIN (NITROSTAT) 0.6 MG SL tablet Place 0.6 mg under the tongue every 5 (five) minutes as needed. For chest pain       . NITROSTAT 0.4 MG SL tablet PLCE 1 TABLET UNDER THE TONGUE AS DIRECTED  25 tablet  5  . ranitidine (ZANTAC) 75 MG tablet Take 75 mg by mouth daily as needed for heartburn.        No current facility-administered medications for this visit.    Allergies:    Allergies  Allergen Reactions  . Nsaids     REACTION: Not a true allergy--GI upset with some.    Social History:  The patient  reports that he has quit smoking. He has never used smokeless  tobacco. He reports that he does not drink alcohol or use illicit drugs.   Family History:  The patient's family history includes Diabetes in his father; Heart failure in his father.   ROS:  Please see the history of present illness.      All other systems reviewed and negative.   PHYSICAL EXAM: VS:  BP 104/56  Pulse 64  Ht 5\' 7"  (1.702 m)  Wt 147 lb (66.679 kg)  BMI 23.02 kg/m2 Well nourished, well developed, in no acute distress HEENT: normal Neck: no JVD Cardiac:  normal S1, S2; RRR; no murmur Lungs:  clear to auscultation bilaterally, no wheezing, rhonchi or rales Abd: soft, nontender, no hepatomegaly Ext: no edema Skin: warm and dry Neuro:  CNs 2-12 intact, no focal abnormalities noted      ASSESSMENT AND PLAN:  1. ASCAD with no angina - continue ASA/Plaivx/Imdur 2. HTN well controlled - continue Metoprolol 3. Dyslipidemia - continue Atorvastatin/fish oil - check fasting lipid panel and ALT  Followup with me in 6 months  Signed, Armanda Magic, MD 07/29/2013 11:20 AM

## 2013-08-13 ENCOUNTER — Other Ambulatory Visit (INDEPENDENT_AMBULATORY_CARE_PROVIDER_SITE_OTHER): Payer: Medicaid Other

## 2013-08-13 DIAGNOSIS — E7800 Pure hypercholesterolemia, unspecified: Secondary | ICD-10-CM

## 2013-08-13 LAB — LIPID PANEL
CHOL/HDL RATIO: 2
Cholesterol: 108 mg/dL (ref 0–200)
HDL: 44.5 mg/dL (ref >39.00)
LDL Cholesterol: 51 mg/dL (ref 0–99)
TRIGLYCERIDES: 61 mg/dL (ref 0.0–149.0)
VLDL: 12.2 mg/dL (ref 0.0–40.0)

## 2013-08-13 LAB — ALT: ALT: 27 U/L (ref 0–53)

## 2013-08-14 ENCOUNTER — Telehealth: Payer: Self-pay | Admitting: Cardiology

## 2013-08-14 NOTE — Telephone Encounter (Signed)
Spoke to pt and made them aware of lab work.

## 2013-08-14 NOTE — Telephone Encounter (Signed)
New message ° ° ° ° °Want test results °

## 2013-09-21 ENCOUNTER — Other Ambulatory Visit: Payer: Self-pay | Admitting: Cardiology

## 2013-10-15 ENCOUNTER — Other Ambulatory Visit: Payer: Self-pay | Admitting: Cardiology

## 2013-10-16 ENCOUNTER — Encounter (HOSPITAL_COMMUNITY): Payer: Self-pay | Admitting: Emergency Medicine

## 2013-10-16 ENCOUNTER — Emergency Department (HOSPITAL_COMMUNITY): Payer: Medicaid Other

## 2013-10-16 ENCOUNTER — Encounter: Payer: Self-pay | Admitting: *Deleted

## 2013-10-16 ENCOUNTER — Encounter: Payer: Self-pay | Admitting: Cardiology

## 2013-10-16 ENCOUNTER — Emergency Department (HOSPITAL_COMMUNITY)
Admission: EM | Admit: 2013-10-16 | Discharge: 2013-10-16 | Discharge status: Against Medical Advice | Payer: Medicaid Other | Attending: Emergency Medicine | Admitting: Emergency Medicine

## 2013-10-16 ENCOUNTER — Telehealth: Payer: Self-pay | Admitting: Cardiology

## 2013-10-16 DIAGNOSIS — Z85118 Personal history of other malignant neoplasm of bronchus and lung: Secondary | ICD-10-CM | POA: Insufficient documentation

## 2013-10-16 DIAGNOSIS — Z8709 Personal history of other diseases of the respiratory system: Secondary | ICD-10-CM | POA: Insufficient documentation

## 2013-10-16 DIAGNOSIS — J449 Chronic obstructive pulmonary disease, unspecified: Secondary | ICD-10-CM | POA: Insufficient documentation

## 2013-10-16 DIAGNOSIS — Z87311 Personal history of (healed) other pathological fracture: Secondary | ICD-10-CM | POA: Insufficient documentation

## 2013-10-16 DIAGNOSIS — E7800 Pure hypercholesterolemia, unspecified: Secondary | ICD-10-CM | POA: Insufficient documentation

## 2013-10-16 DIAGNOSIS — Z8659 Personal history of other mental and behavioral disorders: Secondary | ICD-10-CM | POA: Insufficient documentation

## 2013-10-16 DIAGNOSIS — M79609 Pain in unspecified limb: Secondary | ICD-10-CM | POA: Insufficient documentation

## 2013-10-16 DIAGNOSIS — I2510 Atherosclerotic heart disease of native coronary artery without angina pectoris: Secondary | ICD-10-CM | POA: Insufficient documentation

## 2013-10-16 DIAGNOSIS — Z9861 Coronary angioplasty status: Secondary | ICD-10-CM | POA: Insufficient documentation

## 2013-10-16 DIAGNOSIS — Z87828 Personal history of other (healed) physical injury and trauma: Secondary | ICD-10-CM | POA: Insufficient documentation

## 2013-10-16 DIAGNOSIS — Z8669 Personal history of other diseases of the nervous system and sense organs: Secondary | ICD-10-CM | POA: Insufficient documentation

## 2013-10-16 DIAGNOSIS — E291 Testicular hypofunction: Secondary | ICD-10-CM | POA: Insufficient documentation

## 2013-10-16 DIAGNOSIS — I252 Old myocardial infarction: Secondary | ICD-10-CM | POA: Insufficient documentation

## 2013-10-16 DIAGNOSIS — J4489 Other specified chronic obstructive pulmonary disease: Secondary | ICD-10-CM | POA: Insufficient documentation

## 2013-10-16 DIAGNOSIS — I10 Essential (primary) hypertension: Secondary | ICD-10-CM | POA: Insufficient documentation

## 2013-10-16 DIAGNOSIS — F411 Generalized anxiety disorder: Secondary | ICD-10-CM | POA: Insufficient documentation

## 2013-10-16 DIAGNOSIS — Z87891 Personal history of nicotine dependence: Secondary | ICD-10-CM | POA: Insufficient documentation

## 2013-10-16 DIAGNOSIS — M79602 Pain in left arm: Secondary | ICD-10-CM

## 2013-10-16 LAB — PRO B NATRIURETIC PEPTIDE: Pro B Natriuretic peptide (BNP): 369.9 pg/mL — ABNORMAL HIGH (ref 0–125)

## 2013-10-16 LAB — CBC
HEMATOCRIT: 37.5 % — AB (ref 39.0–52.0)
Hemoglobin: 12.5 g/dL — ABNORMAL LOW (ref 13.0–17.0)
MCH: 29.2 pg (ref 26.0–34.0)
MCHC: 33.3 g/dL (ref 30.0–36.0)
MCV: 87.6 fL (ref 78.0–100.0)
Platelets: 219 10*3/uL (ref 150–400)
RBC: 4.28 MIL/uL (ref 4.22–5.81)
RDW: 12.7 % (ref 11.5–15.5)
WBC: 4.8 10*3/uL (ref 4.0–10.5)

## 2013-10-16 LAB — BASIC METABOLIC PANEL
BUN: 12 mg/dL (ref 6–23)
CALCIUM: 9.7 mg/dL (ref 8.4–10.5)
CO2: 27 mEq/L (ref 19–32)
CREATININE: 0.71 mg/dL (ref 0.50–1.35)
Chloride: 101 mEq/L (ref 96–112)
Glucose, Bld: 108 mg/dL — ABNORMAL HIGH (ref 70–99)
Potassium: 5.1 mEq/L (ref 3.7–5.3)
Sodium: 140 mEq/L (ref 137–147)

## 2013-10-16 LAB — TROPONIN I: Troponin I: <0.3 ng/mL (ref <0.30)

## 2013-10-16 LAB — I-STAT TROPONIN, ED: Troponin i, poc: 0 ng/mL (ref 0.00–0.08)

## 2013-10-16 MED ORDER — OXYCODONE-ACETAMINOPHEN 5-325 MG PO TABS
2.0000 | ORAL_TABLET | Freq: Once | ORAL | Status: AC
  Administered 2013-10-16: 2 via ORAL
  Filled 2013-10-16: qty 2

## 2013-10-16 MED ORDER — ONDANSETRON 4 MG PO TBDP
4.0000 mg | ORAL_TABLET | Freq: Once | ORAL | Status: AC
  Administered 2013-10-16: 4 mg via ORAL
  Filled 2013-10-16: qty 1

## 2013-10-16 NOTE — ED Notes (Signed)
MD at bedside. 

## 2013-10-16 NOTE — Discharge Instructions (Signed)
°  SEEK IMMEDIATE MEDICAL ATTENTION IF: You have severe chest pain, especially if the pain is crushing or pressure-like and spreads to the arms, back, neck, or jaw, or if you have sweating, nausea (feeling sick to your stomach), or shortness of breath. THIS IS AN EMERGENCY. Don't wait to see if the pain will go away. Get medical help at once. Call 911 or 0 (operator). DO NOT drive yourself to the hospital.  Your chest pain gets worse and does not go away with rest.  You have an attack of chest pain lasting longer than usual, despite rest and treatment with the medications your caregiver has prescribed.  You wake from sleep with chest pain or shortness of breath.  You feel dizzy or faint.  You have chest pain not typical of your usual pain for which you originally saw your caregiver.   Discharge Against Medical Advice I am signing this paper to show that I am leaving this hospital or health care center of my own free will. It is done against all medical advice. In doing so, I am releasing this hospital or health care center and the attending physicians from any and all claims that I may want to make. I understand that further care has been recommended. My condition may worsen. This could cause me further bodily injury, illness, or even death. I do know that the medical staff has fully explained to me the risk that I am taking in leaving against medical advice. Please feel free to return immediately to our care should things worsen. Document Released: 04/10/2005 Document Revised: 07/03/2011 Document Reviewed: 09/25/2006 [facility] Patient Information 2015 [employer] [place]. This information is not intended to replace advice given to you by your health care provider. Make sure you discuss any questions you have with your health care provider.

## 2013-10-16 NOTE — ED Notes (Signed)
Pt c/o left arm pain from elbow to hand, has had 3 MI's, has 2 stents, has not had chest pain with any of these MI's-- only left arm pain. Pt pale, has a h/a,

## 2013-10-16 NOTE — Telephone Encounter (Signed)
Agree with checking BP and HR during dizzy spells. Please set up for Stress myoview for CP. Please have patient go to ER if CP persists after taking SL NTG

## 2013-10-16 NOTE — ED Provider Notes (Signed)
CSN: [id number]     Arrival date & time 10/16/13  1630 History   First MD Initiated Contact with Patient 10/16/13 1822     Chief Complaint  Patient presents with  . Chest Pain     (Consider location/radiation/quality/duration/timing/severity/associated sxs/prior Treatment) HPI [name] is a(n) 58 y.o. male who presents to the emergency department chief complaint of left arm pain. He has a past medical history of degenerative disc disease, previous surgeries to the neck. History of MI x3. Patient states that each time he had pain in the left arm. Patient states that for the past several days he's had left arm pain. Describes it as sharp, intermittent, fleeting but severe. He states that today he had increasing frequency of these pains. It is not worsened with movement he denied loss of grip strength. He does state that the last time he had his MI he had arm pain only however this arm pain is different. Denies fevers, chills, myalgias, arthralgias. Denies DOE, SOB, chest  Pain tightness or pressure,  jaw pain, or diaphoresis. Denies dysuria, flank pain, suprapubic pain, frequency, urgency, or hematuria. Denies headaches, light headedness, weakness, visual disturbances. Denies abdominal pain, nausea, vomiting, diarrhea or constipation.    Past Medical History  Diagnosis Date  . Hypertension   . HYPERCHOLESTEROLEMIA   . MI   . PERIPHERAL VASCULAR DISEASE WITH CLAUDICATION, LEFT LEG   . THYROMEGALY   . ANXIETY   . DEPRESSION   . CARPAL TUNNEL SYNDROME, RIGHT   . REACTIVE AIRWAY DISEASE   . COPD   . LUNG NODULES   . DEGENERATIVE DISC DISEASE, CERVICAL SPINE, W/RADICULOPATHY   . ROTATOR CUFF SYNDROME, LEFT   . GANGLION CYST, WRIST, LEFT   . PLANTAR FASCIITIS, LEFT   . HYPERGLYCEMIA   . TESTICULAR HYPOFUNCTION   . Coronary artery disease     s/p bare metal stens X 2 to the RCA, 09/28/2008 EF45-50%.  ath 01/2011 with patent RCA stents and moderate disese elsewhere   Past Surgical  History  Procedure Laterality Date  . Angioplasty / stenting femoral    . Cardiac catheterization      01/2011 cardiac cath with patent stents and normal EF   Family History  Problem Relation Age of Onset  . Heart failure Father   . Diabetes Father    History  Substance Use Topics  . Smoking status: Former Games developermoker  . Smokeless tobacco: Never Used  . Alcohol Use: No    Review of Systems  Ten systems reviewed and are negative for acute change, except as noted in the HPI.    Allergies  Nsaids  Home Medications   Prior to Admission medications   Medication Sig Start Date End Date Taking? Authorizing Athanasios Heldman  amphetamine-dextroamphetamine (ADDERALL) 15 MG tablet Take 15 mg by mouth daily.    Historical Brieonna Crutcher, MD  Ascorbic Acid (VITAMIN C) 1000 MG tablet Take 1,000 mg by mouth daily.      Historical Sreya Froio, MD  atorvastatin (LIPITOR) 80 MG tablet Take 1 tablet (80 mg total) by mouth daily. 06/04/13   Quintella Reichertraci R Turner, MD  buprenorphine-naloxone (SUBOXONE) 8-2 MG SUBL Place 1 tablet under the tongue 3 (three) times daily.      Historical Darelle Kings, MD  cholecalciferol (VITAMIN D) 1000 UNITS tablet Take 1,000 Units by mouth every evening.      Historical Ainsley Deakins, MD  gabapentin (NEURONTIN) 600 MG tablet Take 600 mg by mouth 3 (three) times daily.      Historical  Octavio Matheney, MD  metoprolol (LOPRESSOR) 50 MG tablet Take 25 mg by mouth 2 (two) times daily.      Historical Jousha Schwandt, MD  nitroGLYCERIN (NITROSTAT) 0.6 MG SL tablet Place 0.6 mg under the tongue every 5 (five) minutes as needed. For chest pain     Historical Cierah Crader, MD  NITROSTAT 0.4 MG SL tablet PLCE 1 TABLET UNDER THE TONGUE AS DIRECTED    Quintella Reichertraci R Turner, MD  ranitidine (ZANTAC) 75 MG tablet Take 75 mg by mouth daily as needed for heartburn.     Historical Matt Delpizzo, MD   BP 143/68  Pulse 68  Temp(Src) 97.8 F (36.6 C) (Oral)  Resp 18  SpO2 100% Physical Exam Physical Exam  Nursing note and vitals  reviewed. Constitutional: He appears well-developed and well-nourished. No distress.  HENT:  Head: Normocephalic and atraumatic.  Eyes: Conjunctivae normal are normal. No scleral icterus.  Neck: Normal range of motion. Neck supple.  Cardiovascular: Normal rate, regular rhythm and normal heart sounds.   Pulmonary/Chest: Effort normal and breath sounds normal. No respiratory distress.  Abdominal: Soft. There is no tenderness.  Musculoskeletal: He exhibits no edema.  Neurological: He is alert.  Skin: Skin is warm and dry. He is not diaphoretic.  Psychiatric: His behavior is normal.    ED Course  Procedures (including critical care time) Labs Review Labs Reviewed  CBC - Abnormal; Notable for the following:    Hemoglobin 12.5 (*)    HCT 37.5 (*)    All other components within normal limits  BASIC METABOLIC PANEL - Abnormal; Notable for the following:    Glucose, Bld 108 (*)    All other components within normal limits  PRO B NATRIURETIC PEPTIDE - Abnormal; Notable for the following:    Pro B Natriuretic peptide (BNP) 369.9 (*)    All other components within normal limits  I-STAT TROPOININ, ED    Imaging Review Dg Chest 2 View  10/16/2013   CLINICAL DATA:  COPD  EXAM: CHEST  2 VIEW  COMPARISON:  01/2011  FINDINGS: The lungs are hyperinflated likely secondary to COPD. There is no focal parenchymal opacity, pleural effusion, or pneumothorax. The heart and mediastinal contours are unremarkable.  Prior lower anterior cervical disc fusion. There is mild thoracic spine spondylosis.  IMPRESSION: No active cardiopulmonary disease.   Electronically Signed   By: Elige KoHetal  Patel   On: 10/16/2013 18:23     EKG Interpretation   Date/Time:  Thursday October 16 2013 16:45:32 EDT Ventricular Rate:  63 PR Interval:  148 QRS Duration: 76 QT Interval:  410 QTC Calculation: 419 R Axis:   73 Text Interpretation:  Normal sinus rhythm Normal ECG No significant change  since last tracing Confirmed by  Anitra LauthPLUNKETT  MD, [name] ([id number]) on 10/16/2013  6:14:54 PM      MDM   Final diagnoses:  None   Patient with c/o L arm pain. I most likely die to radiculopathy, howefer his has a sig. pmh for cardiac disease presenting similarly.    7:28 PM Negative troponin, EKG unchanged, labs other wise unremearkable. Moderate Risk HEART score of 5. Will call for admission for cardiac r/o. Currently without pain    Dr. Alvester MorinNewton saw the patient who refused admission for CP R/O. Second set of troponin ordered and negative. Patient will be discharged AMA.  THE PATIENT HAS THE APPARENT CAPACITY TO MAKE AN INFORMED DECISION AND HAS BEEN THOROUGHLY EDUCATED WITH TEACH BACK METHOD ABOUT ALL RISKS INVOLVED WITH LEAVING AMA.  Date: 10/20/2013  Patient: [name] Admitted: 10/16/2013  6:04 PM Attending Keziah Avis: Arthor Captain, PA-C Alex Loveless or his authorized caregiver has made the decision for the patient to leave the emergency department against the advice of Dr. Shelle Iron, Arthor Captain, PA-C.  He or his authorized caregiver has been informed and understands the inherent risks, including death.  He or his authorized caregiver has decided to accept the responsibility for this decision. Dirck Butch Ama and all necessary parties have been advised that he may return for further evaluation or treatment. His condition at time of discharge was Good.  Limmie Schoenberg [facility] had current vital signs as follows:  Blood pressure 123/66, pulse 65, temperature 97.8 F (36.6 C), temperature source Oral, resp. rate 15, SpO2 98.00%.   Alex Loveless or his authorized caregiver has signed the Leaving Against Medical Advice form prior to leaving the department.  Arthor Captain 10/16/2013      Arthor Captain, PA-C 10/20/13 [phone]

## 2013-10-16 NOTE — Telephone Encounter (Signed)
New Message  Pt called states that he is experiencing chest, arm and neck pain. He feels light headed and dizzy at night. Requests a call back from the nurse to discuss these feelings that he has experienced over the past 2 days. Please call.

## 2013-10-16 NOTE — ED Notes (Signed)
Phlebotomy at bedside.

## 2013-10-16 NOTE — Telephone Encounter (Signed)
ERROR//SR ( telephone note would not allow me to close note without routing/))/ please close this note//SR

## 2013-10-16 NOTE — Telephone Encounter (Signed)
PT  CALLED  COMPLAINING WITH ELBOW  PAIN   OFF AND  ON   FOR   3-4  DAYS  ALSO  HAD   1  EPISODE  OF DIZZINESS WHILE BENDING OVER   PER  PT  PAIN IS   DIFFERENT  THAN   WHEN  HAD  CATH  IN   2010  .ALSO   IS  VERY  COMPLIANT   WITH MEDS  . PT  AWARE  WILL FORWARD TO DR  TURNER  FOR REVIEW  ENCOURAGED PT  TO  CHECK B/P  AND  PULSE  OVER  THE  NEXT 7-10  DAYS  TO MAKE  SURE NOT  HYPOTENSIVE OR  BRADYCARDIC  IN REGARDS  TO  THE  DIZZY  EPISODE  .ENCOURAGED  PT  IF S/S  WORSEN  MAY TRY  TAKING  NTG    AS  DIRECTED  ALSO IF   S/S  BECOME MORE  FREQUENT TO  GO TO NEAREST ER  FOR EVAL AND TX./CY

## 2013-10-16 NOTE — Consult Note (Signed)
Hospitalist Consult Note  Patient name: [name] Medical record number: [id number] Date of birth: 10/29/1955 Age: 58 y.o. Gender: male  Primary Care Provider: Julieanne MansonMULBERRY,ELIZABETH, MD  Chief Complaint: L Arm Pain   History of Present Illness:This is a 58 y.o. year old male with noted history of COPD, coronary artery disease status post MI and stenting, degenerative chest disease with radiculopathy presenting with left arm pain. Patient states that he was lifting his 3 days ago. Has had left elbow/arm pain over the past 3-4 days. Pain seems to be worse with movement of the neck especially towards the left side. Patient was concerned about her cardiac sources he said to heart attacks associated with left arm pain. Patient states her left arm pain is not similar to previous episodes of MI. Patient denies any nausea, diaphoresis. Has had some mild intermittent dizziness with bending over, though patient states he's been dehydrated working in the hot sun. Also small headache. Patient called his cardiologist about symptoms. They directed him to try nitroglycerin glycerin if symptoms persisted. Patient has not tried nitroglycerin. Patient came to the ER out of persistence by his wife. On presentation, hemodynamically stable. Afebrile. 7 greater than 90% on room air. Lab work essentially within normal limits apart from hemoglobin 12.5. Pro BNP 370. Denies any orthopnea, PND, lower extremity swelling. Troponin and EKG within normal limits. Was given Percocet resolution of left arm pain.  Assessment and Plan: [name] is a 58 y.o. year old male presenting with left arm pain.  L arm pain: Differential diagnosis includes radicular pain versus atypical chest pain. Patient does have a reproducible component of neck and arm pain associated with Spurling's maneuver though more focal across the neck. Initial troponin within normal limits. EKG normal sinus rhythm thus far. Discussed overall plan with  patient. He would like to go home. Discussed overall risk and benefits at length with patient including myocardial infarction and death. Patient expressed understanding. Is leaving AGAINST MEDICAL ADVICE. He states that he will follow up with his cardiologist first in the morning. I did see a telephone note with his cardiologist 7-Up a nuclear stress test. Wife expressed understanding of plan and states that she will bring him in if arm pain worsens. Overall plan was also discussed with ER physician assistant.    Patient Active Problem List   Diagnosis Date Noted  . TESTICULAR HYPOFUNCTION 04/29/2010  . UPPER RESPIRATORY INFECTION, ACUTE 01/06/2010  . FATIGUE 01/06/2010  . COUGH 01/06/2010  . COPD 10/29/2009  . ERECTILE DYSFUNCTION, ORGANIC 05/18/2009  . LEG CRAMPS 05/18/2009  . ANKLE PAIN, RIGHT 02/26/2009  . MI 09/27/2008  . CAD 09/27/2008  . PERIPHERAL VASCULAR DISEASE WITH CLAUDICATION, LEFT LEG 08/28/2008  . DEGENERATIVE DISC DISEASE, LUMBOSACRAL SPINE 08/28/2008  . LEG PAIN, BILATERAL 08/18/2008  . THYROMEGALY 06/26/2008  . GANGLION CYST, WRIST, LEFT 06/26/2008  . HYPERGLYCEMIA 06/26/2008  . HYPERCHOLESTEROLEMIA 06/06/2008  . CARPAL TUNNEL SYNDROME, RIGHT 09/12/2007  . ESSENTIAL HYPERTENSION 09/12/2007  . PAIN IN JOINT PELVIC REGION AND THIGH 05/23/2007  . PLANTAR FASCIITIS, LEFT 05/23/2007  . DEPRESSION 04/07/2007  . REACTIVE AIRWAY DISEASE 01/21/2007  . ABSCESS, TOOTH 01/21/2007  . SKIN RASH 01/21/2007  . ANXIETY 01/05/2007  . TOBACCO ABUSE 01/05/2007  . INSOMNIA 01/05/2007  . ALCOHOL ABUSE, HX OF 01/05/2007  . LIVER FUNCTION TESTS, ABNORMAL, HX OF 01/05/2007  . DEGENERATIVE DISC DISEASE, CERVICAL SPINE, W/RADICULOPATHY 05/04/2006  . ROTATOR CUFF SYNDROME, LEFT 05/04/2006  . LUNG NODULES 01/02/2006   Past Medical  History: Past Medical History  Diagnosis Date  . Hypertension   . HYPERCHOLESTEROLEMIA   . MI   . PERIPHERAL VASCULAR DISEASE WITH CLAUDICATION, LEFT  LEG   . THYROMEGALY   . ANXIETY   . DEPRESSION   . CARPAL TUNNEL SYNDROME, RIGHT   . REACTIVE AIRWAY DISEASE   . COPD   . LUNG NODULES   . DEGENERATIVE DISC DISEASE, CERVICAL SPINE, W/RADICULOPATHY   . ROTATOR CUFF SYNDROME, LEFT   . GANGLION CYST, WRIST, LEFT   . PLANTAR FASCIITIS, LEFT   . HYPERGLYCEMIA   . TESTICULAR HYPOFUNCTION   . Coronary artery disease     s/p bare metal stens X 2 to the RCA, 09/28/2008 EF45-50%.  ath 01/2011 with patent RCA stents and moderate disese elsewhere    Past Surgical History: Past Surgical History  Procedure Laterality Date  . Angioplasty / stenting femoral    . Cardiac catheterization      01/2011 cardiac cath with patent stents and normal EF    Social History: History   Social History  . Marital Status: Single    Spouse Name: N/A    Number of Children: N/A  . Years of Education: N/A   Social History Main Topics  . Smoking status: Former Games developer  . Smokeless tobacco: Never Used  . Alcohol Use: No  . Drug Use: No  . Sexual Activity: Yes   Other Topics Concern  . None   Social History Narrative  . None    Family History: Family History  Problem Relation Age of Onset  . Heart failure Father   . Diabetes Father     Allergies: Allergies  Allergen Reactions  . Nsaids     REACTION: Not a true allergy--GI upset with some.    No current facility-administered medications for this encounter.   Current Outpatient Prescriptions  Medication Sig Dispense Refill  . amphetamine-dextroamphetamine (ADDERALL) 15 MG tablet Take 15 mg by mouth daily.      . Ascorbic Acid (VITAMIN C) 1000 MG tablet Take 1,000 mg by mouth daily.        Marland Kitchen aspirin 325 MG tablet Take 325 mg by mouth every morning.      Marland Kitchen atorvastatin (LIPITOR) 80 MG tablet Take 1 tablet (80 mg total) by mouth daily.  90 tablet  3  . B Complex Vitamins (VITAMIN B-COMPLEX PO) Take 1 tablet by mouth daily.      . buprenorphine-naloxone (SUBOXONE) 8-2 MG SUBL Place 1 tablet  under the tongue 2 (two) times daily.       . cholecalciferol (VITAMIN D) 1000 UNITS tablet Take 1,000 Units by mouth every evening.        . clopidogrel (PLAVIX) 75 MG tablet Take 75 mg by mouth daily.      Marland Kitchen gabapentin (NEURONTIN) 600 MG tablet Take 600 mg by mouth 3 (three) times daily.        . isosorbide mononitrate (IMDUR) 30 MG 24 hr tablet Take 30 mg by mouth daily.      . metoprolol (LOPRESSOR) 50 MG tablet Take 25 mg by mouth 2 (two) times daily.        . nitroGLYCERIN (NITROSTAT) 0.6 MG SL tablet Place 0.6 mg under the tongue every 5 (five) minutes as needed. For chest pain       . ranitidine (ZANTAC) 75 MG tablet Take 75 mg by mouth daily as needed for heartburn.        Review Of Systems: 12 point ROS negative  except as noted above in HPI.  Physical Exam: Filed Vitals:   10/16/13 1930  BP: 120/64  Pulse: 56  Temp:   Resp: 12    General: alert and cooperative HEENT: PERRLA, extra ocular movement intact and + neck pain with lateral neck movement, spurlings mildly positive on L.  Heart: S1, S2 normal, no murmur, rub or gallop, regular rate and rhythm Lungs: clear to auscultation, no wheezes or rales and unlabored breathing Abdomen: abdomen is soft without significant tenderness, masses, organomegaly or guarding Extremities: extremities normal, atraumatic, no cyanosis or edema Skin:no rashes, no ecchymoses Neurology: normal without focal findings  Labs and Imaging: Lab Results  Component Value Date/Time   NA 140 10/16/2013  4:56 PM   K 5.1 10/16/2013  4:56 PM   CL 101 10/16/2013  4:56 PM   CO2 27 10/16/2013  4:56 PM   BUN 12 10/16/2013  4:56 PM   CREATININE 0.71 10/16/2013  4:56 PM   GLUCOSE 108* 10/16/2013  4:56 PM   Lab Results  Component Value Date   WBC 4.8 10/16/2013   HGB 12.5* 10/16/2013   HCT 37.5* 10/16/2013   MCV 87.6 10/16/2013   PLT 219 10/16/2013    Dg Chest 2 View  10/16/2013   CLINICAL DATA:  COPD  EXAM: CHEST  2 VIEW  COMPARISON:  01/2011  FINDINGS: The  lungs are hyperinflated likely secondary to COPD. There is no focal parenchymal opacity, pleural effusion, or pneumothorax. The heart and mediastinal contours are unremarkable.  Prior lower anterior cervical disc fusion. There is mild thoracic spine spondylosis.  IMPRESSION: No active cardiopulmonary disease.   Electronically Signed   By: Elige KoHetal  Patel   On: 10/16/2013 18:23           Doree AlbeeSteven Newton MD  Pager: [phone]

## 2013-10-17 NOTE — Telephone Encounter (Signed)
LMTRC

## 2013-10-21 NOTE — ED Provider Notes (Signed)
Medical screening examination/treatment/procedure(s) were performed by non-physician practitioner and as supervising physician I was immediately available for consultation/collaboration.   EKG Interpretation   Date/Time:  Thursday October 16 2013 16:45:32 EDT Ventricular Rate:  63 PR Interval:  148 QRS Duration: 76 QT Interval:  410 QTC Calculation: 419 R Axis:   73 Text Interpretation:  Normal sinus rhythm Normal ECG No significant change  since last tracing Confirmed by Anitra LauthPLUNKETT  MD, [name] ([id number]) on 10/16/2013  6:14:54 PM        Alex SproutWhitney Oluwaferanmi Wain, MD 10/21/13 [phone]

## 2013-10-21 NOTE — Telephone Encounter (Signed)
Pt states there is not CP and/or pressure  Pt does not want the stress test. He stated that he feels much better and his Dr thought it was a mix of his medications and the heat which caused him to feel the way he did on the 25th. He said he feels much better and he is following up with his PCP about dizziness and headaches which they think are related to allergies.

## 2013-11-13 ENCOUNTER — Other Ambulatory Visit: Payer: Self-pay | Admitting: Cardiology

## 2013-11-14 ENCOUNTER — Encounter: Payer: Self-pay | Admitting: Cardiology

## 2013-12-16 ENCOUNTER — Other Ambulatory Visit: Payer: Self-pay | Admitting: Cardiology

## 2014-02-06 ENCOUNTER — Ambulatory Visit: Payer: Medicaid Other | Admitting: Cardiology

## 2014-02-07 ENCOUNTER — Other Ambulatory Visit: Payer: Self-pay | Admitting: Cardiology

## 2014-03-09 ENCOUNTER — Other Ambulatory Visit: Payer: Self-pay | Admitting: Cardiology

## 2014-03-12 ENCOUNTER — Ambulatory Visit: Payer: Medicaid Other | Admitting: Cardiology

## 2014-03-15 ENCOUNTER — Other Ambulatory Visit: Payer: Self-pay | Admitting: Cardiology

## 2014-04-06 ENCOUNTER — Ambulatory Visit: Payer: Medicaid Other | Admitting: Cardiology

## 2014-04-07 ENCOUNTER — Encounter: Payer: Self-pay | Admitting: Cardiology

## 2014-04-07 ENCOUNTER — Ambulatory Visit (INDEPENDENT_AMBULATORY_CARE_PROVIDER_SITE_OTHER): Payer: Medicaid Other | Admitting: Cardiology

## 2014-04-07 VITALS — BP 134/82 | HR 74 | Ht 67.0 in | Wt 149.6 lb

## 2014-04-07 DIAGNOSIS — I10 Essential (primary) hypertension: Secondary | ICD-10-CM

## 2014-04-07 DIAGNOSIS — I2510 Atherosclerotic heart disease of native coronary artery without angina pectoris: Secondary | ICD-10-CM

## 2014-04-07 DIAGNOSIS — E7800 Pure hypercholesterolemia, unspecified: Secondary | ICD-10-CM

## 2014-04-07 MED ORDER — ASPIRIN 81 MG PO TABS: 81.0000 mg | ORAL_TABLET | ORAL | Status: AC

## 2014-04-07 NOTE — Patient Instructions (Signed)
Your physician recommends that you return for FASTING lab work on Tuesday. Dec. 22.   Your physician has recommended you make the following change in your medication:  1) DECREASE Aspirin to 81 mg daily.  Your physician wants you to follow-up in: 6 months with Dr. Mayford Knifeurner. You will receive a reminder letter in the mail two months in advance. If you don't receive a letter, please call our office to schedule the follow-up appointment.

## 2014-04-07 NOTE — Progress Notes (Signed)
[address] [place], [place]  [address] Phone: [phone] Fax:  [phone]  Date:  04/07/2014   ID:  [name], DOB 1955-11-07, MRN [id number]  PCP:  No PCP Per Patient  Cardiologist:  Armanda Magicraci Turner, MD    History of Present Illness: [name] is a 58 y.o. male with a history of CAD, HTN, dyslipidemia and PVD who presents for followup. He is doing well. He denies any chest pain, SOB, DOE, LE edema, dizziness, palpitations or syncope. He walks for exercise.  He walks 2 miles daily for exercise.    Wt Readings from Last 3 Encounters:  04/07/14 149 lb 9.6 oz (67.858 kg)  07/29/13 147 lb (66.679 kg)  01/30/13 149 lb (67.586 kg)     Past Medical History  Diagnosis Date  . Hypertension   . HYPERCHOLESTEROLEMIA   . MI   . PERIPHERAL VASCULAR DISEASE WITH CLAUDICATION, LEFT LEG   . THYROMEGALY   . ANXIETY   . DEPRESSION   . CARPAL TUNNEL SYNDROME, RIGHT   . REACTIVE AIRWAY DISEASE   . COPD   . LUNG NODULES   . DEGENERATIVE DISC DISEASE, CERVICAL SPINE, W/RADICULOPATHY   . ROTATOR CUFF SYNDROME, LEFT   . GANGLION CYST, WRIST, LEFT   . PLANTAR FASCIITIS, LEFT   . HYPERGLYCEMIA   . TESTICULAR HYPOFUNCTION   . Coronary artery disease     s/p bare metal stens X 2 to the RCA, 09/28/2008 EF45-50%.  ath 01/2011 with patent RCA stents and moderate disese elsewhere    Current Outpatient Prescriptions  Medication Sig Dispense Refill  . amphetamine-dextroamphetamine (ADDERALL) 15 MG tablet Take 15 mg by mouth daily.    . Ascorbic Acid (VITAMIN C) 1000 MG tablet Take 1,000 mg by mouth daily.      Marland Kitchen. aspirin 325 MG tablet Take 325 mg by mouth every morning.    Marland Kitchen. atorvastatin (LIPITOR) 80 MG tablet Take 1 tablet (80 mg total) by mouth daily. 90 tablet 3  . B Complex Vitamins (VITAMIN B-COMPLEX PO) Take 1 tablet by mouth daily.    . buprenorphine-naloxone (SUBOXONE) 8-2 MG SUBL Place 1 tablet under the tongue 2 (two) times daily.     . cholecalciferol  (VITAMIN D) 1000 UNITS tablet Take 1,000 Units by mouth every evening.      . clopidogrel (PLAVIX) 75 MG tablet TAKE 1 TABLET BY MOUTH EVERY DAY 90 tablet 0  . gabapentin (NEURONTIN) 600 MG tablet Take 600 mg by mouth 3 (three) times daily.      . isosorbide mononitrate (IMDUR) 30 MG 24 hr tablet TAKE 1 TABLET BY MOUTH EVERY DAY 30 tablet 1  . metoprolol (LOPRESSOR) 50 MG tablet TAKE 1/2 TABLET BY MOUTH TWICE DAILY 90 tablet 1  . nitroGLYCERIN (NITROSTAT) 0.6 MG SL tablet Place 0.6 mg under the tongue every 5 (five) minutes as needed. For chest pain     . ranitidine (ZANTAC) 75 MG tablet Take 75 mg by mouth daily as needed for heartburn.      No current facility-administered medications for this visit.    Allergies:    Allergies  Allergen Reactions  . Nsaids     REACTION: Not a true allergy--GI upset with some.    Social History:  The patient  reports that he has quit smoking. He has never used smokeless tobacco. He reports that he does not drink alcohol or use illicit drugs.   Family History:  The patient's family history includes Diabetes  in his father; Heart failure in his father.   ROS:  Please see the history of present illness.      All other systems reviewed and negative.   PHYSICAL EXAM: VS:  BP 134/82 mmHg  Pulse 74  Ht 5\' 7"  (1.702 m)  Wt 149 lb 9.6 oz (67.858 kg)  BMI 23.43 kg/m2  SpO2 97% Well nourished, well developed, in no acute distress HEENT: normal Neck: no JVD Cardiac:  normal S1, S2; RRR; no murmur Lungs:  clear to auscultation bilaterally, no wheezing, rhonchi or rales Abd: soft, nontender, no hepatomegaly Ext: no edema Skin: warm and dry Neuro:  CNs 2-12 intact, no focal abnormalities noted    ASSESSMENT AND PLAN:  1. ASCAD with no angina - continue ASA/Plaivx/Imdur - decrease ASA to 81mg  daily 2. HTN well controlled - continue Metoprolol 3. Dyslipidemia - continue Atorvastatin/fish oil - check fasting lipid panel and ALT  Followup with me  in 6 months   Signed, Armanda Magicraci Turner, MD [facility] 04/07/2014 11:43 AM

## 2014-04-14 ENCOUNTER — Other Ambulatory Visit: Payer: Medicaid Other

## 2014-05-08 ENCOUNTER — Other Ambulatory Visit: Payer: Self-pay | Admitting: Cardiology

## 2014-05-11 ENCOUNTER — Other Ambulatory Visit: Payer: Self-pay

## 2014-05-11 ENCOUNTER — Other Ambulatory Visit: Payer: Self-pay | Admitting: Cardiology

## 2014-05-11 MED ORDER — ISOSORBIDE MONONITRATE ER 30 MG PO TB24: 30.0000 mg | ORAL_TABLET | Freq: Every day | ORAL | Status: DC

## 2014-05-12 ENCOUNTER — Other Ambulatory Visit: Payer: Self-pay

## 2014-05-12 MED ORDER — ISOSORBIDE MONONITRATE ER 30 MG PO TB24: 30.0000 mg | ORAL_TABLET | Freq: Every day | ORAL | Status: DC

## 2014-05-21 ENCOUNTER — Other Ambulatory Visit: Payer: Self-pay | Admitting: Cardiology

## 2014-05-21 ENCOUNTER — Other Ambulatory Visit: Payer: Self-pay | Admitting: *Deleted

## 2014-05-21 MED ORDER — ATORVASTATIN CALCIUM 80 MG PO TABS
80.0000 mg | ORAL_TABLET | Freq: Every day | ORAL | Status: DC
Start: 2014-05-21 — End: 2014-05-21

## 2014-05-26 ENCOUNTER — Other Ambulatory Visit (INDEPENDENT_AMBULATORY_CARE_PROVIDER_SITE_OTHER): Payer: Medicaid Other | Admitting: *Deleted

## 2014-05-26 DIAGNOSIS — I2510 Atherosclerotic heart disease of native coronary artery without angina pectoris: Secondary | ICD-10-CM

## 2014-05-26 DIAGNOSIS — E7800 Pure hypercholesterolemia, unspecified: Secondary | ICD-10-CM

## 2014-05-26 LAB — LIPID PANEL
Cholesterol: 114 mg/dL (ref 0–200)
HDL: 49.9 mg/dL (ref >39.00)
LDL Cholesterol: 40 mg/dL (ref 0–99)
NonHDL: 64.1
Total CHOL/HDL Ratio: 2
Triglycerides: 119 mg/dL (ref 0.0–149.0)
VLDL: 23.8 mg/dL (ref 0.0–40.0)

## 2014-05-26 LAB — ALT: ALT: 21 U/L (ref 0–53)

## 2014-06-10 ENCOUNTER — Other Ambulatory Visit: Payer: Self-pay | Admitting: Cardiology

## 2014-07-03 ENCOUNTER — Encounter: Payer: Self-pay | Admitting: Cardiology

## 2014-09-03 ENCOUNTER — Other Ambulatory Visit: Payer: Self-pay | Admitting: Cardiology

## 2014-09-24 NOTE — Progress Notes (Signed)
Cardiology Office Note   Date:  09/25/2014   ID:  [name], DOB 17-Sep-1955, MRN [id number]  PCP:  No PCP Per Patient    Chief Complaint  Patient presents with  . Follow-up    essential hypertension      History of Present Illness: [name] is a 59 y.o. male with a history of CAD, HTN, dyslipidemia and PVD who presents for followup. He is doing well. He denies any chest pain, SOB, DOE, LE edema, dizziness, palpitations or syncope. He walks for exercise. He walks 2 miles daily for exercise.     Past Medical History  Diagnosis Date  . Hypertension   . HYPERCHOLESTEROLEMIA   . MI   . PERIPHERAL VASCULAR DISEASE WITH CLAUDICATION, LEFT LEG   . THYROMEGALY   . ANXIETY   . DEPRESSION   . CARPAL TUNNEL SYNDROME, RIGHT   . REACTIVE AIRWAY DISEASE   . COPD   . LUNG NODULES   . DEGENERATIVE DISC DISEASE, CERVICAL SPINE, W/RADICULOPATHY   . ROTATOR CUFF SYNDROME, LEFT   . GANGLION CYST, WRIST, LEFT   . PLANTAR FASCIITIS, LEFT   . HYPERGLYCEMIA   . TESTICULAR HYPOFUNCTION   . Coronary artery disease     s/p bare metal stens X 2 to the RCA, 09/28/2008 EF45-50%.  ath 01/2011 with patent RCA stents and moderate disese elsewhere    Past Surgical History  Procedure Laterality Date  . Angioplasty / stenting femoral    . Cardiac catheterization      01/2011 cardiac cath with patent stents and normal EF     Current Outpatient Prescriptions  Medication Sig Dispense Refill  . amphetamine-dextroamphetamine (ADDERALL) 15 MG tablet Take 15 mg by mouth daily.    . Ascorbic Acid (VITAMIN C) 1000 MG tablet Take 1,000 mg by mouth daily.      Marland Kitchen. aspirin 81 MG tablet Take 1 tablet (81 mg total) by mouth every morning.    Marland Kitchen. atorvastatin (LIPITOR) 80 MG tablet TAKE 1 TABLET BY MOUTH DAILY 90 tablet 1  . B Complex Vitamins (VITAMIN B-COMPLEX PO) Take 1 tablet by mouth daily.    . buprenorphine-naloxone (SUBOXONE) 8-2 MG SUBL Place 1 tablet under the tongue  2 (two) times daily.     . cholecalciferol (VITAMIN D) 1000 UNITS tablet Take 1,000 Units by mouth every evening.      . clopidogrel (PLAVIX) 75 MG tablet TAKE 1 TABLET BY MOUTH EVERY DAY 90 tablet 0  . gabapentin (NEURONTIN) 600 MG tablet Take 600 mg by mouth 3 (three) times daily.      . isosorbide mononitrate (IMDUR) 30 MG 24 hr tablet TAKE 1 TABLET BY MOUTH DAILY 90 tablet 3  . metoprolol (LOPRESSOR) 50 MG tablet TAKE 1/2 TABLET BY MOUTH TWICE DAILY 90 tablet 0  . nitroGLYCERIN (NITROSTAT) 0.6 MG SL tablet Place 0.6 mg under the tongue every 5 (five) minutes as needed. For chest pain     . ranitidine (ZANTAC) 75 MG tablet Take 75 mg by mouth daily as needed for heartburn.     . SUBOXONE 8-2 MG FILM Place 1 application under the tongue 2 (two) times daily.  2   No current facility-administered medications for this visit.    Allergies:   Nsaids    Social History:  The patient  reports that he has quit smoking. He has never used smokeless tobacco. He reports  that he does not drink alcohol or use illicit drugs.   Family History:  The patient's family history includes Diabetes in his father; Heart failure in his father.    ROS:  Please see the history of present illness.   Otherwise, review of systems are positive for back and muscle.   All other systems are reviewed and negative.    PHYSICAL EXAM: VS:  BP 130/72 mmHg  Pulse 67  Ht  (1.702 m)  Wt 149 lb (67.586 kg)  BMI 23.33 kg/m2 , BMI Body mass index is 23.33 kg/(m^2). GEN: Well nourished, well developed, in no acute distress HEENT: normal Neck: no JVD, carotid bruits, or masses Cardiac: RRR; no murmurs, rubs, or gallops,no edema  Respiratory:  clear to auscultation bilaterally, normal work of breathing GI: soft, nontender, nondistended, + BS MS: no deformity or atrophy Skin: warm and dry, no rash Neuro:  Strength and sensation are intact Psych: euthymic mood, full affect   EKG:  EKG was ordered today and showed NSR  with no ST changes and normal intervals    Recent Labs: 10/16/2013: BUN 12; Creatinine 0.71; Hemoglobin 12.5*; Platelets 219; Potassium 5.1; Pro B Natriuretic peptide (BNP) 369.9*; Sodium 140 05/26/2014: ALT 21    Lipid Panel    Component Value Date/Time   CHOL 114 05/26/2014 1202   TRIG 119.0 05/26/2014 1202   HDL 49.90 05/26/2014 1202   CHOLHDL 2 05/26/2014 1202   VLDL 23.8 05/26/2014 1202   LDLCALC 40 05/26/2014 1202      Wt Readings from Last 3 Encounters:  09/25/14 149 lb (67.586 kg)  04/07/14 149 lb 9.6 oz (67.858 kg)  07/29/13 147 lb (66.679 kg)     ASSESSMENT AND PLAN:  1. ASCAD with no angina - continue ASA/Plaivx/Imdur/BB 2. HTN well controlled - continue Metoprolol 3. Dyslipidemia - LDL at goal at 40 - continue Atorvastatin/fish oil   Current medicines are reviewed at length with the patient today.  The patient does not have concerns regarding medicines.  The following changes have been made:  no change  Labs/ tests ordered today: See above Assessment and Plan No orders of the defined types were placed in this encounter.     Disposition:   FU with me in 1 year  Signed, Quintella Reichert, MD  09/25/2014 11:29 AM    [facility] [address] [place], [place], [place]  [address] Phone: [phone]; Fax: [phone]

## 2014-09-25 ENCOUNTER — Encounter: Payer: Self-pay | Admitting: Cardiology

## 2014-09-25 ENCOUNTER — Ambulatory Visit (INDEPENDENT_AMBULATORY_CARE_PROVIDER_SITE_OTHER): Payer: Medicaid Other | Admitting: Cardiology

## 2014-09-25 VITALS — BP 130/72 | HR 67 | Ht 67.0 in | Wt 149.0 lb

## 2014-09-25 DIAGNOSIS — E7800 Pure hypercholesterolemia, unspecified: Secondary | ICD-10-CM

## 2014-09-25 DIAGNOSIS — I2510 Atherosclerotic heart disease of native coronary artery without angina pectoris: Secondary | ICD-10-CM | POA: Diagnosis not present

## 2014-09-25 DIAGNOSIS — I10 Essential (primary) hypertension: Secondary | ICD-10-CM

## 2014-09-25 NOTE — Patient Instructions (Signed)

## 2014-12-03 ENCOUNTER — Other Ambulatory Visit: Payer: Self-pay | Admitting: Cardiology

## 2014-12-15 ENCOUNTER — Other Ambulatory Visit: Payer: Self-pay | Admitting: Cardiology

## 2014-12-17 ENCOUNTER — Other Ambulatory Visit: Payer: Self-pay | Admitting: Cardiology

## 2015-01-05 ENCOUNTER — Other Ambulatory Visit: Payer: Self-pay

## 2015-01-05 MED ORDER — NITROGLYCERIN 0.6 MG SL SUBL: 0.6000 mg | SUBLINGUAL_TABLET | SUBLINGUAL | Status: DC | PRN

## 2015-01-05 NOTE — Telephone Encounter (Signed)
Alex Reichert, MD at 09/24/2014 7:11 PM  nitroGLYCERIN (NITROSTAT) 0.6 MG SL tabletPlace 0.6 mg under the tongue every 5 (five) minutes as needed. For chest pain Current medicines are reviewed at length with the patient today. The patient does not have concerns regarding medicines.  The following changes have been made: no change

## 2015-01-07 ENCOUNTER — Telehealth: Payer: Self-pay

## 2015-01-07 NOTE — Telephone Encounter (Signed)
PA initiated for Nitrostat 0.6mg  sl to [facility]. Inquiry # [id number]. Will notify us in approx 24 hours.

## 2015-01-08 ENCOUNTER — Telehealth: Payer: Self-pay

## 2015-01-08 NOTE — Telephone Encounter (Signed)
Called [facility] to check on status of Nitrostat0.6 sl. Case is still pending.

## 2015-01-11 ENCOUNTER — Telehealth: Payer: Self-pay

## 2015-01-11 NOTE — Telephone Encounter (Signed)
Approval of Nitrostat 0.6mg  from [facility], good for 1 year. Auth #[id number]. Pharmacy notified.

## 2015-06-07 ENCOUNTER — Other Ambulatory Visit: Payer: Self-pay | Admitting: Cardiology

## 2015-06-13 ENCOUNTER — Other Ambulatory Visit: Payer: Self-pay | Admitting: Cardiology

## 2015-06-17 ENCOUNTER — Other Ambulatory Visit: Payer: Self-pay | Admitting: Cardiology

## 2015-08-31 ENCOUNTER — Telehealth: Payer: Self-pay | Admitting: Cardiology

## 2015-08-31 NOTE — Telephone Encounter (Signed)
[facility] [phone]

## 2015-09-01 ENCOUNTER — Other Ambulatory Visit: Payer: Self-pay | Admitting: Cardiology

## 2015-09-10 ENCOUNTER — Other Ambulatory Visit: Payer: Self-pay | Admitting: Cardiology

## 2015-09-13 ENCOUNTER — Other Ambulatory Visit: Payer: Self-pay | Admitting: Cardiology

## 2015-09-30 ENCOUNTER — Encounter: Payer: Self-pay | Admitting: Cardiology

## 2015-10-04 ENCOUNTER — Ambulatory Visit (INDEPENDENT_AMBULATORY_CARE_PROVIDER_SITE_OTHER): Payer: Medicaid Other | Admitting: Cardiology

## 2015-10-04 ENCOUNTER — Encounter: Payer: Self-pay | Admitting: Cardiology

## 2015-10-04 VITALS — BP 142/84 | HR 71 | Ht 67.0 in | Wt 149.0 lb

## 2015-10-04 DIAGNOSIS — E7800 Pure hypercholesterolemia, unspecified: Secondary | ICD-10-CM | POA: Diagnosis not present

## 2015-10-04 DIAGNOSIS — I2510 Atherosclerotic heart disease of native coronary artery without angina pectoris: Secondary | ICD-10-CM | POA: Diagnosis not present

## 2015-10-04 DIAGNOSIS — I10 Essential (primary) hypertension: Secondary | ICD-10-CM | POA: Diagnosis not present

## 2015-10-04 LAB — HEPATIC FUNCTION PANEL
ALBUMIN: 3.9 g/dL (ref 3.6–5.1)
ALK PHOS: 81 U/L (ref 40–115)
ALT: 17 U/L (ref 9–46)
AST: 23 U/L (ref 10–35)
BILIRUBIN TOTAL: 0.4 mg/dL (ref 0.2–1.2)
Bilirubin, Direct: 0.2 mg/dL (ref <=0.2)
Indirect Bilirubin: 0.2 mg/dL (ref 0.2–1.2)
TOTAL PROTEIN: 6.7 g/dL (ref 6.1–8.1)

## 2015-10-04 LAB — LIPID PANEL
CHOL/HDL RATIO: 1.8 ratio (ref <=5.0)
CHOLESTEROL: 112 mg/dL — AB (ref 125–200)
HDL: 61 mg/dL (ref >=40)
LDL Cholesterol: 39 mg/dL (ref <130)
Triglycerides: 58 mg/dL (ref <150)
VLDL: 12 mg/dL (ref <30)

## 2015-10-04 NOTE — Patient Instructions (Signed)
Medication Instructions:  Your physician recommends that you continue on your current medications as directed. Please refer to the Current Medication list given to you today.   Labwork: TODAY: LFTs, Lipids  Testing/Procedures: None  Follow-Up: Your physician wants you to follow-up in: 1 year with Dr. Turner. You will receive a reminder letter in the mail two months in advance. If you don't receive a letter, please call our office to schedule the follow-up appointment.   Any Other Special Instructions Will Be Listed Below (If Applicable).     If you need a refill on your cardiac medications before your next appointment, please call your pharmacy.   

## 2015-10-04 NOTE — Progress Notes (Signed)
Cardiology Office Note    Date:  10/04/2015   ID:  [name], DOB [birth year], MRN [id number]  PCP:  No PCP Per Patient  Cardiologist:  Armanda Magicraci Turner, MD   Chief Complaint  Patient presents with  . Coronary Artery Disease  . Hypertension  . Hyperlipidemia    History of Present Illness:  [name] is a 60 y.o. male with a history of CAD (s/p BMS x2 to RCA with EF 45-50%, repeat cath 01/2011 with patent stents and moderate disease elsewhere), HTN, dyslipidemia and PVD who presents for followup. He is doing well. He denies any chest pain, SOB, DOE, LE edema, dizziness, palpitations or syncope. He denies any cramps in his legs when he walks but does sometimes at night.  He walks for exercise. He walks 1.5 miles daily for exercise.    Past Medical History  Diagnosis Date  . Hypertension   . HYPERCHOLESTEROLEMIA   . MI   . PERIPHERAL VASCULAR DISEASE WITH CLAUDICATION, LEFT LEG   . THYROMEGALY   . ANXIETY   . DEPRESSION   . CARPAL TUNNEL SYNDROME, RIGHT   . REACTIVE AIRWAY DISEASE   . COPD   . LUNG NODULES   . DEGENERATIVE DISC DISEASE, CERVICAL SPINE, W/RADICULOPATHY   . ROTATOR CUFF SYNDROME, LEFT   . GANGLION CYST, WRIST, LEFT   . PLANTAR FASCIITIS, LEFT   . HYPERGLYCEMIA   . TESTICULAR HYPOFUNCTION   . Coronary artery disease     s/p bare metal stens X 2 to the RCA, 09/28/2008 EF45-50%.  ath 01/2011 with patent RCA stents and moderate disese elsewhere    Past Surgical History  Procedure Laterality Date  . Angioplasty / stenting femoral    . Cardiac catheterization      01/2011 cardiac cath with patent stents and normal EF    Current Medications: Outpatient Prescriptions Prior to Visit  Medication Sig Dispense Refill  . amphetamine-dextroamphetamine (ADDERALL) 15 MG tablet Take 15 mg by mouth daily.    . Ascorbic Acid (VITAMIN C) 1000 MG tablet Take 1,000 mg by mouth daily.      Marland Kitchen. aspirin 81 MG tablet Take 1 tablet (81 mg total) by mouth every  morning.    Marland Kitchen. atorvastatin (LIPITOR) 80 MG tablet TAKE 1 TABLET BY MOUTH EVERY DAY 90 tablet 0  . B Complex Vitamins (VITAMIN B-COMPLEX PO) Take 1 tablet by mouth daily.    . cholecalciferol (VITAMIN D) 1000 UNITS tablet Take 1,000 Units by mouth every evening.      . clopidogrel (PLAVIX) 75 MG tablet TAKE 1 TABLET BY MOUTH DAILY 90 tablet 0  . gabapentin (NEURONTIN) 600 MG tablet Take 600 mg by mouth 3 (three) times daily.      . isosorbide mononitrate (IMDUR) 30 MG 24 hr tablet TAKE 1 TABLET BY MOUTH EVERY DAY 90 tablet 1  . metoprolol (LOPRESSOR) 50 MG tablet TAKE 1/2 TABLET BY MOUTH TWICE DAILY 90 tablet 1  . nitroGLYCERIN (NITROSTAT) 0.6 MG SL tablet Place 1 tablet (0.6 mg total) under the tongue every 5 (five) minutes as needed. For chest pain 25 tablet 2  . ranitidine (ZANTAC) 75 MG tablet Take 75 mg by mouth daily as needed for heartburn.     . SUBOXONE 8-2 MG FILM Place 1 application under the tongue 2 (two) times daily.  2  . atorvastatin (LIPITOR) 80 MG tablet TAKE 1 TABLET BY MOUTH EVERY DAY (Patient not taking: Reported on 10/04/2015) 90 tablet 1  . buprenorphine-naloxone (  SUBOXONE) 8-2 MG SUBL Place 1 tablet under the tongue 2 (two) times daily. Reported on 10/04/2015     No facility-administered medications prior to visit.     Allergies:   Nsaids   Social History   Social History  . Marital Status: Single    Spouse Name: N/A  . Number of Children: N/A  . Years of Education: N/A   Social History Main Topics  . Smoking status: Former Games developer  . Smokeless tobacco: Never Used  . Alcohol Use: No  . Drug Use: No  . Sexual Activity: Yes   Other Topics Concern  . None   Social History Narrative     Family History:  The patient's family history includes Diabetes in his father; Heart failure in his father.   ROS:   Please see the history of present illness.    ROS All other systems reviewed and are negative.   PHYSICAL EXAM:   VS:  BP 142/84 mmHg  Pulse 71  Ht 5'  7" (1.702 m)  Wt 149 lb (67.586 kg)  BMI 23.33 kg/m2   GEN: Well nourished, well developed, in no acute distress HEENT: normal Neck: no JVD, carotid bruits, or masses Cardiac: RRR; no murmurs, rubs, or gallops,no edema.  Intact distal pulses bilaterally.  Respiratory:  clear to auscultation bilaterally, normal work of breathing GI: soft, nontender, nondistended, + BS MS: no deformity or atrophy Skin: warm and dry, no rash Neuro:  Alert and Oriented x 3, Strength and sensation are intact Psych: euthymic mood, full affect  Wt Readings from Last 3 Encounters:  10/04/15 149 lb (67.586 kg)  09/25/14 149 lb (67.586 kg)  04/07/14 149 lb 9.6 oz (67.858 kg)      Studies/Labs Reviewed:   EKG:  EKG is ordered today and NSR at 71bpm with no ST changes  Recent Labs: No results found for requested labs within last 365 days.   Lipid Panel    Component Value Date/Time   CHOL 114 05/26/2014 1202   TRIG 119.0 05/26/2014 1202   HDL 49.90 05/26/2014 1202   CHOLHDL 2 05/26/2014 1202   VLDL 23.8 05/26/2014 1202   LDLCALC 40 05/26/2014 1202    Additional studies/ records that were reviewed today include:  none    ASSESSMENT:    1. Atherosclerosis of native coronary artery of native heart without angina pectoris   2. Essential hypertension   3. HYPERCHOLESTEROLEMIA      PLAN:  In order of problems listed above:  1. ASCAD s/p BMS to RCA x 2 with cath 01/2011 with patent stents.  He has no angina.  Continue ASA/Plavix/statin/long acting nitrates and BB. 2. HTN - BP controlled on current meds.  Continue BB. 3. Hyperlipidemia with LDL goal < 70.  Check FLP and ALT. Continue high dose statin.    Medication Adjustments/Labs and Tests Ordered: Current medicines are reviewed at length with the patient today.  Concerns regarding medicines are outlined above.  Medication changes, Labs and Tests ordered today are listed in the Patient Instructions below.  There are no Patient  Instructions on file for this visit.   Signed, Armanda Magic, MD  10/04/2015 11:30 AM    [facility] [address] [place], [place], [place]  [address] Phone: [phone]; Fax: [phone]

## 2015-12-08 ENCOUNTER — Other Ambulatory Visit: Payer: Self-pay | Admitting: Cardiology

## 2016-02-21 ENCOUNTER — Other Ambulatory Visit: Payer: Self-pay | Admitting: Cardiology

## 2016-03-02 ENCOUNTER — Other Ambulatory Visit: Payer: Self-pay | Admitting: Cardiology

## 2016-05-18 ENCOUNTER — Other Ambulatory Visit: Payer: Self-pay | Admitting: Cardiology

## 2016-08-28 ENCOUNTER — Other Ambulatory Visit: Payer: Self-pay | Admitting: Cardiology

## 2016-09-01 ENCOUNTER — Other Ambulatory Visit: Payer: Self-pay | Admitting: Cardiology

## 2016-09-11 ENCOUNTER — Encounter: Payer: Self-pay | Admitting: Physician Assistant

## 2016-09-11 ENCOUNTER — Ambulatory Visit (INDEPENDENT_AMBULATORY_CARE_PROVIDER_SITE_OTHER): Payer: Medicaid Other | Admitting: Physician Assistant

## 2016-09-11 VITALS — BP 128/70 | HR 82 | Ht 67.0 in | Wt 143.2 lb

## 2016-09-11 DIAGNOSIS — I10 Essential (primary) hypertension: Secondary | ICD-10-CM | POA: Diagnosis not present

## 2016-09-11 DIAGNOSIS — E7800 Pure hypercholesterolemia, unspecified: Secondary | ICD-10-CM

## 2016-09-11 DIAGNOSIS — I2510 Atherosclerotic heart disease of native coronary artery without angina pectoris: Secondary | ICD-10-CM | POA: Diagnosis not present

## 2016-09-11 MED ORDER — NITROGLYCERIN 0.6 MG SL SUBL: 0.6000 mg | SUBLINGUAL_TABLET | SUBLINGUAL | 2 refills | Status: DC | PRN

## 2016-09-11 MED ORDER — METOPROLOL TARTRATE 50 MG PO TABS: 25.0000 mg | ORAL_TABLET | Freq: Two times a day (BID) | ORAL | 3 refills | Status: DC

## 2016-09-11 MED ORDER — ATORVASTATIN CALCIUM 80 MG PO TABS: 80.0000 mg | ORAL_TABLET | Freq: Every day | ORAL | 3 refills | Status: DC

## 2016-09-11 MED ORDER — ISOSORBIDE MONONITRATE ER 30 MG PO TB24: 30.0000 mg | ORAL_TABLET | Freq: Every day | ORAL | 3 refills | Status: DC

## 2016-09-11 MED ORDER — CLOPIDOGREL BISULFATE 75 MG PO TABS: 75.0000 mg | ORAL_TABLET | Freq: Every day | ORAL | 3 refills | Status: DC

## 2016-09-11 NOTE — Progress Notes (Signed)
Cardiology Office Note    Date:  09/11/2016   ID:  Natthew, Marlatt April 03, 1956, MRN [id number]  PCP:  Charlies Silvers, PA-C  Cardiologist: Dr. Mayford Knife  Chief Complaint  Patient presents with  . Follow-up    History of Present Illness:  [name] is a 61 y.o. male with a history of CAD (s/p BMS x2 to RCA with EF 45-50%, repeat cath 01/2011 with patent stents and moderate disease elsewhere), HTN, dyslipidemia and PVD.  Patient comes in today for yearly follow-up. He walks all day long, works in the yard, does a lot of planting and remains active. He denies any chest pain, palpitations, dyspnea, dyspnea on exertion, dizziness, or presyncope. He just saw the primary care 2 weeks ago and had blood work drawn which I reviewed and was all stable. They did not do cholesterol check.      Past Medical History:  Diagnosis Date  . ANXIETY   . CARPAL TUNNEL SYNDROME, RIGHT   . COPD   . Coronary artery disease    s/p bare metal stens X 2 to the RCA, 09/28/2008 EF45-50%.  ath 01/2011 with patent RCA stents and moderate disese elsewhere  . DEGENERATIVE DISC DISEASE, CERVICAL SPINE, W/RADICULOPATHY   . DEPRESSION   . GANGLION CYST, WRIST, LEFT   . HYPERCHOLESTEROLEMIA   . HYPERGLYCEMIA   . Hypertension   . LUNG NODULES   . MI   . PERIPHERAL VASCULAR DISEASE WITH CLAUDICATION, LEFT LEG   . PLANTAR FASCIITIS, LEFT   . REACTIVE AIRWAY DISEASE   . ROTATOR CUFF SYNDROME, LEFT   . TESTICULAR HYPOFUNCTION   . THYROMEGALY     Past Surgical History:  Procedure Laterality Date  . ANGIOPLASTY / STENTING FEMORAL    . CARDIAC CATHETERIZATION     01/2011 cardiac cath with patent stents and normal EF    Current Medications: Outpatient Medications Prior to Visit  Medication Sig Dispense Refill  . amphetamine-dextroamphetamine (ADDERALL) 15 MG tablet Take 15 mg by mouth daily.    . Ascorbic Acid (VITAMIN C) 1000 MG tablet Take 1,000 mg by mouth daily.      Marland Kitchen aspirin 81 MG  tablet Take 1 tablet (81 mg total) by mouth every morning.    Marland Kitchen atorvastatin (LIPITOR) 80 MG tablet Take 1 tablet (80 mg total) by mouth daily. *Please call and schedule a one year follow up appointment* 30 tablet 0  . B Complex Vitamins (VITAMIN B-COMPLEX PO) Take 1 tablet by mouth daily.    . cholecalciferol (VITAMIN D) 1000 UNITS tablet Take 1,000 Units by mouth every evening.      . clopidogrel (PLAVIX) 75 MG tablet Take 1 tablet (75 mg total) by mouth daily. *Please call and schedule a one year follow up appointment* 30 tablet 0  . gabapentin (NEURONTIN) 600 MG tablet Take 600 mg by mouth 3 (three) times daily.      . isosorbide mononitrate (IMDUR) 30 MG 24 hr tablet Take 1 tablet (30 mg total) by mouth daily. PT NEEDS APPT, CALL [phone] TO SCHEDULE, 1ST ATTEMPT 30 tablet 0  . metoprolol (LOPRESSOR) 50 MG tablet TAKE 1/2 TABLET BY MOUTH TWICE DAILY 90 tablet 1  . nitroGLYCERIN (NITROSTAT) 0.6 MG SL tablet Place 1 tablet (0.6 mg total) under the tongue every 5 (five) minutes as needed. For chest pain 25 tablet 2  . ranitidine (ZANTAC) 75 MG tablet Take 75 mg by mouth daily as needed for heartburn.     Kevan Ny  8-2 MG FILM Place 1 application under the tongue daily. 1 1/2  2   No facility-administered medications prior to visit.      Allergies:   Nsaids   Social History   Social History  . Marital status: Single    Spouse name: N/A  . Number of children: N/A  . Years of education: N/A   Social History Main Topics  . Smoking status: Former Games developermoker  . Smokeless tobacco: Never Used  . Alcohol use No  . Drug use: No  . Sexual activity: Yes   Other Topics Concern  . None   Social History Narrative  . None     Family History:  The patient's   family history includes Diabetes in his father; Heart failure in his father.   ROS:   Please see the history of present illness.    Review of Systems  Constitution: Negative.  HENT: Negative.   Cardiovascular: Negative.     Respiratory: Negative.   Endocrine: Negative.   Hematologic/Lymphatic: Negative.   Musculoskeletal: Negative.   Gastrointestinal: Negative.   Genitourinary: Negative.   Neurological: Negative.    All other systems reviewed and are negative.   PHYSICAL EXAM:   VS:  BP 128/70   Pulse 82   Ht 5\' 7"  (1.702 m)   Wt 143 lb 3.2 oz (65 kg)   SpO2 96%   BMI 22.43 kg/m   Physical Exam  GEN: Well nourished, well developed, in no acute distress  Neck: no JVD, carotid bruits, or masses Cardiac:RRR; no murmurs, rubs, or gallops  Respiratory:  Decreased breath sounds with scattered wheezing GI: soft, nontender, nondistended, + BS Ext: without cyanosis, clubbing, or edema, Good distal pulses bilaterally Neuro:  Alert and Oriented x 3 Psych: euthymic mood, full affect  Wt Readings from Last 3 Encounters:  09/11/16 143 lb 3.2 oz (65 kg)  10/04/15 149 lb (67.6 kg)  09/25/14 149 lb (67.6 kg)      Studies/Labs Reviewed:   EKG:  EKG is ordered today.  The ekg ordered today demonstrates Normal sinus rhythm, normal EKG  Recent Labs: 10/04/2015: ALT 17   Lipid Panel    Component Value Date/Time   CHOL 112 (L) 10/04/2015 1145   TRIG 58 10/04/2015 1145   HDL 61 10/04/2015 1145   CHOLHDL 1.8 10/04/2015 1145   VLDL 12 10/04/2015 1145   LDLCALC 39 10/04/2015 1145    Additional studies/ records that were reviewed today include:   Echo 2008SUMMARY  -  Overall left ventricular systolic function was normal. Left        ventricular ejection fraction was estimated to be 65 %. There        was no diagnostic evidence of left ventricular regional wall        motion abnormalities.  Nuclear stress 2011IMPRESSION: Stable inferior wall fixed perfusion defect consistent with scar. No evidence of inducible ischemia.  Normal left ventricular function with calculated ejection fraction of 59%.     ASSESSMENT:    1. Atherosclerosis of native coronary artery of native heart without angina  pectoris   2. Essential hypertension   3. HYPERCHOLESTEROLEMIA      PLAN:  In order of problems listed above:  CAD status post BMS 2 to the RCA in 2010 LVEF 45-50%, repeat cath 2012 with patent RCA stents and moderate disease elsewhere. Exercising regularly without angina. Continue Plavix, aspirin, Lipitor, Imdur, metoprolol. Follow-up with Dr. Mayford Knifeurner in one year  Essential hypertension controlled  Hypercholesterolemia on  Lipitor. Will check fasting lipid panel and LFTs. He may have it done his primary care.    Medication Adjustments/Labs and Tests Ordered: Current medicines are reviewed at length with the patient today.  Concerns regarding medicines are outlined above.  Medication changes, Labs and Tests ordered today are listed in the Patient Instructions below. Patient Instructions  Medication Instructions:  Your physician recommends that you continue on your current medications as directed. Please refer to the Current Medication list given to you today.   Labwork: None ordered  Testing/Procedures: None ordered  Follow-Up: Your physician recommends that you schedule a follow-up appointment in:    Any Other Special Instructions Will Be Listed Below (If Applicable).   If you need a refill on your cardiac medications before your next appointment, please call your pharmacy.      Elson Clan, PA-C  09/11/2016 2:03 PM    [facility] [address] [place], [place], [place]  [address] Phone: [phone]; Fax: [phone]

## 2016-09-11 NOTE — Patient Instructions (Addendum)
Medication Instructions:  Your physician recommends that you continue on your current medications as directed. Please refer to the Current Medication list given to you today.   Labwork: None ordered FROM HERE BUT WOULD LIKE A FASTING LIPID & LFT AT YOUR PCP AND SEND US A COPY TO [phone]  Testing/Procedures: None ordered  Follow-Up: Your physician wants you to follow-up in: 1 YEAR WITH DR. Sherlyn LickURNER   You will receive a reminder letter in the mail two months in advance. If you don't receive a letter, please call our office to schedule the follow-up appointment.    Any Other Special Instructions Will Be Listed Below (If Applicable).   If you need a refill on your cardiac medications before your next appointment, please call your pharmacy.

## 2016-12-15 ENCOUNTER — Other Ambulatory Visit: Payer: Self-pay | Admitting: Physician Assistant

## 2017-06-18 ENCOUNTER — Other Ambulatory Visit: Payer: Self-pay | Admitting: Physician Assistant

## 2017-09-18 ENCOUNTER — Other Ambulatory Visit: Payer: Self-pay | Admitting: Physician Assistant

## 2017-10-09 ENCOUNTER — Encounter: Payer: Self-pay | Admitting: Cardiology

## 2017-10-09 ENCOUNTER — Ambulatory Visit: Payer: Medicaid Other | Admitting: Cardiology

## 2017-10-09 VITALS — BP 130/70 | HR 68 | Ht 67.0 in | Wt 141.6 lb

## 2017-10-09 DIAGNOSIS — I2510 Atherosclerotic heart disease of native coronary artery without angina pectoris: Secondary | ICD-10-CM | POA: Diagnosis not present

## 2017-10-09 DIAGNOSIS — E7800 Pure hypercholesterolemia, unspecified: Secondary | ICD-10-CM | POA: Diagnosis not present

## 2017-10-09 DIAGNOSIS — I10 Essential (primary) hypertension: Secondary | ICD-10-CM | POA: Diagnosis not present

## 2017-10-09 NOTE — Patient Instructions (Signed)
Medication Instructions:  Your physician recommends that you continue on your current medications as directed. Please refer to the Current Medication list given to you today.  If you need a refill on your cardiac medications, please contact your pharmacy first.  Labwork: None ordered   Testing/Procedures: None ordered   Follow-Up: Your physician wants you to follow-up in: 1 year with Dr. Turner. You will receive a reminder letter in the mail two months in advance. If you don't receive a letter, please call our office to schedule the follow-up appointment.  Any Other Special Instructions Will Be Listed Below (If Applicable).   Thank you for choosing [facility]    Rena Mohan Erven, RN  [phone]  If you need a refill on your cardiac medications before your next appointment, please call your pharmacy.   

## 2017-10-09 NOTE — Progress Notes (Signed)
Cardiology Office Note:    Date:  10/09/2017   ID:  Alex LovelessNathan W Matthews, DOB January 11, 1956, MRN [id number]  PCP:  Charlies Silversouillard, Jennifer, PA-C  Cardiologist:  No primary care provider on file.    Referring MD: Charlies Silversouillard, Jennifer, PA*   Chief Complaint  Patient presents with  . Coronary Artery Disease  . Hypertension  . Hyperlipidemia    History of Present Illness:    [name] is a 62 y.o. male with a hx of CAD (s/p BMS x2 to RCA with EF 45-50%, repeat cath 01/2011 with patent stents and moderate disease elsewhere), HTN, dyslipidemia and PVD.  He is here today for followup and is doing well.  He denies any chest pain or pressure, SOB, DOE, PND, orthopnea, LE edema, dizziness, palpitations or syncope. He is compliant with his meds and is tolerating meds with no SE.    Past Medical History:  Diagnosis Date  . ANXIETY   . CARPAL TUNNEL SYNDROME, RIGHT   . COPD   . Coronary artery disease    s/p bare metal stens X 2 to the RCA, 09/28/2008 EF45-50%.  ath 01/2011 with patent RCA stents and moderate disese elsewhere  . DEGENERATIVE DISC DISEASE, CERVICAL SPINE, W/RADICULOPATHY   . DEPRESSION   . GANGLION CYST, WRIST, LEFT   . HYPERCHOLESTEROLEMIA   . HYPERGLYCEMIA   . Hypertension   . LUNG NODULES   . MI   . PERIPHERAL VASCULAR DISEASE WITH CLAUDICATION, LEFT LEG   . PLANTAR FASCIITIS, LEFT   . REACTIVE AIRWAY DISEASE   . ROTATOR CUFF SYNDROME, LEFT   . TESTICULAR HYPOFUNCTION   . THYROMEGALY     Past Surgical History:  Procedure Laterality Date  . ANGIOPLASTY / STENTING FEMORAL    . CARDIAC CATHETERIZATION     01/2011 cardiac cath with patent stents and normal EF    Current Medications: Current Meds  Medication Sig  . amphetamine-dextroamphetamine (ADDERALL) 15 MG tablet Take 15 mg by mouth daily.  . Ascorbic Acid (VITAMIN C) 1000 MG tablet Take 1,000 mg by mouth daily.    Marland Kitchen. aspirin 81 MG tablet Take 1 tablet (81 mg total) by mouth every morning.  Marland Kitchen. atorvastatin  (LIPITOR) 80 MG tablet TAKE 1 TABLET(80 MG) BY MOUTH DAILY  . B Complex Vitamins (VITAMIN B-COMPLEX PO) Take 1 tablet by mouth daily.  . cholecalciferol (VITAMIN D) 1000 UNITS tablet Take 1,000 Units by mouth every evening.    . clopidogrel (PLAVIX) 75 MG tablet TAKE 1 TABLET(75 MG) BY MOUTH DAILY  . gabapentin (NEURONTIN) 600 MG tablet Take 600 mg by mouth 3 (three) times daily.    . isosorbide mononitrate (IMDUR) 30 MG 24 hr tablet TAKE 1 TABLET BY MOUTH EVERY DAY  . metoprolol tartrate (LOPRESSOR) 50 MG tablet Take 0.5 tablets (25 mg total) by mouth 2 (two) times daily.  . nitroGLYCERIN (NITROSTAT) 0.6 MG SL tablet PLACE 1 TABLET UNDER THE TONGUE EVERY 5 MINUTES AS NEEDED FOR CHEST PAIN  . ranitidine (ZANTAC) 75 MG tablet Take 75 mg by mouth daily as needed for heartburn.   . SUBOXONE 8-2 MG FILM Place 1 application under the tongue daily. 1 1/2     Allergies:   Nsaids   Social History   Socioeconomic History  . Marital status: Single    Spouse name: Not on file  . Number of children: Not on file  . Years of education: Not on file  . Highest education level: Not on file  Occupational History  .  Not on file  Social Needs  . Financial resource strain: Not on file  . Food insecurity:    Worry: Not on file    Inability: Not on file  . Transportation needs:    Medical: Not on file    Non-medical: Not on file  Tobacco Use  . Smoking status: Former Games developer  . Smokeless tobacco: Never Used  Substance and Sexual Activity  . Alcohol use: No  . Drug use: No  . Sexual activity: Yes  Lifestyle  . Physical activity:    Days per week: Not on file    Minutes per session: Not on file  . Stress: Not on file  Relationships  . Social connections:    Talks on phone: Not on file    Gets together: Not on file    Attends religious service: Not on file    Active member of club or organization: Not on file    Attends meetings of clubs or organizations: Not on file    Relationship status:  Not on file  Other Topics Concern  . Not on file  Social History Narrative  . Not on file     Family History: The patient's family history includes Diabetes in his father; Heart failure in his father.  ROS:   Please see the history of present illness.    ROS  All other systems reviewed and negative.   EKGs/Labs/Other Studies Reviewed:    The following studies were reviewed today: none  EKG:  EKG is  ordered today.  The ekg ordered today demonstrates NSR at 68bpm with no ST changes  Recent Labs: No results found for requested labs within last 8760 hours.   Recent Lipid Panel    Component Value Date/Time   CHOL 112 (L) 10/04/2015 1145   TRIG 58 10/04/2015 1145   HDL 61 10/04/2015 1145   CHOLHDL 1.8 10/04/2015 1145   VLDL 12 10/04/2015 1145   LDLCALC 39 10/04/2015 1145    Physical Exam:    VS:  BP 130/70   Pulse 68   Ht 5\' 7"  (1.702 m)   Wt 141 lb 9.6 oz (64.2 kg)   BMI 22.18 kg/m     Wt Readings from Last 3 Encounters:  10/09/17 141 lb 9.6 oz (64.2 kg)  09/11/16 143 lb 3.2 oz (65 kg)  10/04/15 149 lb (67.6 kg)     GEN:  Well nourished, well developed in no acute distress HEENT: Normal NECK: No JVD; No carotid bruits LYMPHATICS: No lymphadenopathy CARDIAC: RRR, no murmurs, rubs, gallops RESPIRATORY:  Clear to auscultation without rales, wheezing or rhonchi  ABDOMEN: Soft, non-tender, non-distended MUSCULOSKELETAL:  No edema; No deformity  SKIN: Warm and dry NEUROLOGIC:  Alert and oriented x 3 PSYCHIATRIC:  Normal affect   ASSESSMENT:    1. Atherosclerosis of native coronary artery of native heart without angina pectoris   2. Essential hypertension   3. HYPERCHOLESTEROLEMIA    PLAN:    In order of problems listed above:  1.  ASCAD - s/p BMS x2 to RCA with EF 45-50%, repeat cath 01/2011 with patent stents and moderate disease elsewhere.  He has not had any anginal symptoms.  He will continue on Plavix 75 mg daily, aspirin 81 mg daily, high-dose  statin, Imdur 30 mg daily Lopressor 25 mg twice daily.  2.  HTN - BP is well controlled on exam today.  He will continue on Lopressor 25 mg twice daily.  3.  Hyperlipidemia with LDL goal <  70.  He will continue on atorvastatin 80 mg daily.  His LDL was 39 on 10/04/2015.  I will repeat FLP and ALT.   Medication Adjustments/Labs and Tests Ordered: Current medicines are reviewed at length with the patient today.  Concerns regarding medicines are outlined above.  No orders of the defined types were placed in this encounter.  No orders of the defined types were placed in this encounter.   Signed, Armanda Magic, MD  10/09/2017 2:48 PM    [facility]

## 2017-10-10 ENCOUNTER — Telehealth: Payer: Self-pay

## 2017-10-10 DIAGNOSIS — E785 Hyperlipidemia, unspecified: Secondary | ICD-10-CM

## 2017-10-10 NOTE — Telephone Encounter (Signed)
-----   Message from Quintella Reichertraci R Turner, MD sent at 10/10/2017  9:21 AM EDT ----- Regarding: RE: labs Please tell patient that PCP has not ordered FLP and ALT in some time and he needs to come in for labs or we cannot prescribe his cholesterol medication  Traci Turner ----- Message ----- From: Phineas Semenobertson, Jaxiel Kines, RN Sent: 10/09/2017   4:41 PM To: Quintella Reichertraci R Turner, MD Subject: labs                                           Pt's last blood work was on 08/17/17, only a CMET. Pt does not have a recent FLP or ALT. Labs are under care everywhere   [place]

## 2017-10-10 NOTE — Telephone Encounter (Signed)
Called pt and left message to call back to schedule lab appointment for FLP and LFT.

## 2017-10-19 NOTE — Telephone Encounter (Signed)
Left message to call back  

## 2017-10-22 NOTE — Telephone Encounter (Signed)
Pt stated he had been out of town and he could come in on 10/30/17 for FLP and LFT. Pt scheduled for labs on 10/30/17. He stated understanding and thankful for the call

## 2017-10-30 ENCOUNTER — Other Ambulatory Visit: Payer: Medicaid Other

## 2017-10-30 DIAGNOSIS — E785 Hyperlipidemia, unspecified: Secondary | ICD-10-CM

## 2017-10-31 LAB — LIPID PANEL
Chol/HDL Ratio: 2 ratio (ref 0.0–5.0)
Cholesterol, Total: 113 mg/dL (ref 100–199)
HDL: 56 mg/dL (ref >39)
LDL Calculated: 47 mg/dL (ref 0–99)
Triglycerides: 48 mg/dL (ref 0–149)
VLDL Cholesterol Cal: 10 mg/dL (ref 5–40)

## 2017-10-31 LAB — HEPATIC FUNCTION PANEL
ALBUMIN: 3.9 g/dL (ref 3.6–4.8)
ALK PHOS: 86 IU/L (ref 39–117)
ALT: 17 IU/L (ref 0–44)
AST: 25 IU/L (ref 0–40)
BILIRUBIN TOTAL: 0.3 mg/dL (ref 0.0–1.2)
Bilirubin, Direct: 0.09 mg/dL (ref 0.00–0.40)
TOTAL PROTEIN: 7.1 g/dL (ref 6.0–8.5)

## 2017-11-07 ENCOUNTER — Other Ambulatory Visit: Payer: Self-pay | Admitting: Physician Assistant

## 2017-12-18 ENCOUNTER — Other Ambulatory Visit: Payer: Self-pay | Admitting: Physician Assistant

## 2017-12-23 ENCOUNTER — Other Ambulatory Visit: Payer: Self-pay | Admitting: Physician Assistant

## 2018-06-17 ENCOUNTER — Telehealth: Payer: Self-pay | Admitting: Cardiology

## 2018-06-17 NOTE — Telephone Encounter (Signed)
Error

## 2018-09-20 ENCOUNTER — Telehealth: Payer: Self-pay

## 2018-09-20 NOTE — Telephone Encounter (Signed)
Left message for patient to call back regarding upcoming virtual visit with Dr Mayford Knife. Need to get verbal consent for virtual visit.

## 2018-09-23 ENCOUNTER — Telehealth (INDEPENDENT_AMBULATORY_CARE_PROVIDER_SITE_OTHER): Payer: Medicaid Other | Admitting: Cardiology

## 2018-09-23 ENCOUNTER — Other Ambulatory Visit: Payer: Self-pay

## 2018-09-23 ENCOUNTER — Telehealth: Payer: Self-pay

## 2018-09-23 ENCOUNTER — Encounter: Payer: Self-pay | Admitting: Cardiology

## 2018-09-23 DIAGNOSIS — I2510 Atherosclerotic heart disease of native coronary artery without angina pectoris: Secondary | ICD-10-CM | POA: Diagnosis not present

## 2018-09-23 DIAGNOSIS — E7800 Pure hypercholesterolemia, unspecified: Secondary | ICD-10-CM

## 2018-09-23 DIAGNOSIS — I10 Essential (primary) hypertension: Secondary | ICD-10-CM

## 2018-09-23 MED ORDER — ATORVASTATIN CALCIUM 80 MG PO TABS: ORAL_TABLET | ORAL | 3 refills | Status: DC

## 2018-09-23 MED ORDER — NITROGLYCERIN 0.6 MG SL SUBL: SUBLINGUAL_TABLET | SUBLINGUAL | 3 refills | Status: DC

## 2018-09-23 NOTE — Progress Notes (Signed)
Virtual Visit via Telephone Note   This visit type was conducted due to national recommendations for restrictions regarding the COVID-19 Pandemic (e.g. social distancing) in an effort to limit this patient's exposure and mitigate transmission in our community.  Due to his co-morbid illnesses, this patient is at least at moderate risk for complications without adequate follow up.  This format is felt to be most appropriate for this patient at this time.  The patient did not have access to video technology/had technical difficulties with video requiring transitioning to audio format only (telephone).  All issues noted in this document were discussed and addressed.  No physical exam could be performed with this format.  Please refer to the patient's chart for his  consent to telehealth for [facility]   Evaluation Performed:  Follow-up visit  This visit type was conducted due to national recommendations for restrictions regarding the COVID-19 Pandemic (e.g. social distancing).  This format is felt to be most appropriate for this patient at this time.  All issues noted in this document were discussed and addressed.  No physical exam was performed (except for noted visual exam findings with Video Visits).  Please refer to the patient's chart (MyChart message for video visits and phone note for telephone visits) for the patient's consent to telehealth for [facility].  Date:  09/23/2018   ID:  [name], DOB [birth year], MRN [id number]  Patient Location:  Home  Provider location:   [place]  PCP:  Charlies Silvers, PA-C  Cardiologist:  Armanda Magic, MD Electrophysiologist:  None   Chief Complaint:  CAD, HTN, lipids  History of Present Illness:    [name] is a 63 y.o. male who presents via audio/video conferencing for a telehealth visit today.    [name] is a 63 y.o. male with a hx of CAD (s/p BMS x2 to RCA with EF 45-50%, repeat cath 01/2011 with patent  stents and moderate disease elsewhere), HTN, dyslipidemia and PVD. He is here today for followup and is doing well.  He denies any chest pain or pressure, SOB, DOE, PND, orthopnea, LE edema, dizziness, palpitations or syncope. He is compliant with his meds and is tolerating meds with no SE.    The patient does not have symptoms concerning for COVID-19 infection (fever, chills, cough, or new shortness of breath).    Prior CV studies:   The following studies were reviewed today:  none  Past Medical History:  Diagnosis Date  . ANXIETY   . CARPAL TUNNEL SYNDROME, RIGHT   . COPD   . Coronary artery disease    s/p bare metal stens X 2 to the RCA, 09/28/2008 EF45-50%.  ath 01/2011 with patent RCA stents and moderate disese elsewhere  . DEGENERATIVE DISC DISEASE, CERVICAL SPINE, W/RADICULOPATHY   . DEPRESSION   . GANGLION CYST, WRIST, LEFT   . HYPERCHOLESTEROLEMIA   . HYPERGLYCEMIA   . Hypertension   . LUNG NODULES   . MI   . PERIPHERAL VASCULAR DISEASE WITH CLAUDICATION, LEFT LEG   . PLANTAR FASCIITIS, LEFT   . REACTIVE AIRWAY DISEASE   . ROTATOR CUFF SYNDROME, LEFT   . TESTICULAR HYPOFUNCTION   . THYROMEGALY    Past Surgical History:  Procedure Laterality Date  . ANGIOPLASTY / STENTING FEMORAL    . CARDIAC CATHETERIZATION     01/2011 cardiac cath with patent stents and normal EF     Current Meds  Medication Sig  . amphetamine-dextroamphetamine (ADDERALL) 15 MG tablet  Take 15 mg by mouth daily.  . Ascorbic Acid (VITAMIN C) 1000 MG tablet Take 1,000 mg by mouth daily.    Marland Kitchen aspirin 81 MG tablet Take 1 tablet (81 mg total) by mouth every morning.  Marland Kitchen atorvastatin (LIPITOR) 80 MG tablet TAKE 1 TABLET(80 MG) BY MOUTH DAILY  . B Complex Vitamins (VITAMIN B-COMPLEX PO) Take 1 tablet by mouth daily.  . cholecalciferol (VITAMIN D) 1000 UNITS tablet Take 1,000 Units by mouth every evening.    . clopidogrel (PLAVIX) 75 MG tablet TAKE 1 TABLET(75 MG) BY MOUTH DAILY  . gabapentin (NEURONTIN)  600 MG tablet Take 600 mg by mouth 3 (three) times daily.    . isosorbide mononitrate (IMDUR) 30 MG 24 hr tablet TAKE 1 TABLET BY MOUTH EVERY DAY  . metoprolol tartrate (LOPRESSOR) 50 MG tablet Take 0.5 tablets (25 mg total) by mouth 2 (two) times daily.  . nitroGLYCERIN (NITROSTAT) 0.6 MG SL tablet PLACE 1 TABLET UNDER THE TONGUE EVERY 5 MINUTES AS NEEDED FOR CHEST PAIN  . SUBOXONE 8-2 MG FILM Place 1 application under the tongue daily. 1 1/2 applications     Allergies:   Nsaids   Social History   Tobacco Use  . Smoking status: Former Games developer  . Smokeless tobacco: Never Used  Substance Use Topics  . Alcohol use: No  . Drug use: No     Family Hx: The patient's family history includes Diabetes in his father; Heart failure in his father.  ROS:   Please see the history of present illness.     All other systems reviewed and are negative.   Labs/Other Tests and Data Reviewed:    Recent Labs: 10/30/2017: ALT 17   Recent Lipid Panel Lab Results  Component Value Date/Time   CHOL 113 10/30/2017 02:18 PM   TRIG 48 10/30/2017 02:18 PM   HDL 56 10/30/2017 02:18 PM   CHOLHDL 2.0 10/30/2017 02:18 PM   CHOLHDL 1.8 10/04/2015 11:45 AM   LDLCALC 47 10/30/2017 02:18 PM    Wt Readings from Last 3 Encounters:  10/09/17 141 lb 9.6 oz (64.2 kg)  09/11/16 143 lb 3.2 oz (65 kg)  10/04/15 149 lb (67.6 kg)     Objective:    Vital Signs:  Ht 5' 7.5" (1.715 m)   BMI 21.85 kg/m    ASSESSMENT & PLAN:    1.  ASCAD - s/p BMS x2 to RCA with EF 45-50%, repeat cath 01/2011 with patent stents and moderate disease elsewhere.  He has not had any anginal symptoms since I saw him last.  He will continue on aspirin 81 mg daily, Plavix 75 mg daily, beta-blocker and statin.  He is walking and working out in the yard without any problems.    2.  Hypertension -his blood pressure has been controlled when he is in a doctor's office visits and when he checks it at the drug store.  He will continue on  Lopressor 25 mg twice daily.  3.  Hyperlipidemia - his LDL goal is less than 70.  His LDL was 47 a year ago.  I will repeat an FLP and ALT in July.  He will continue on atorvastatin 80 mg daily. I refilled his atorvastatin.    4.  COVID-19 Education:The signs and symptoms of COVID-19 were discussed with the patient and how to seek care for testing (follow up with PCP or arrange E-visit).  The importance of social distancing was discussed today.  Patient Risk:   After full review  of this patient's clinical status, I feel that they are at least moderate risk at this time.  Time:   Today, I have spent 10 minutes directly with the patient on telephone discussing medical problems including CAD, HTN and lipids.  We also reviewed the symptoms of COVID 19 and the ways to protect against contracting the virus with telehealth technology.  I spent an additional 5 minutes reviewing patient's chart including labs.  Medication Adjustments/Labs and Tests Ordered: Current medicines are reviewed at length with the patient today.  Concerns regarding medicines are outlined above.  Tests Ordered: No orders of the defined types were placed in this encounter.  Medication Changes: No orders of the defined types were placed in this encounter.   Disposition:  Follow up in 1 year(s)  Signed, Armanda Magicraci [place] Wiseman, MD  09/23/2018 12:31 PM    [facility]

## 2018-09-23 NOTE — Patient Instructions (Signed)
Medication Instructions:  Your physician recommends that you continue on your current medications as directed. Please refer to the Current Medication list given to you today.  If you need a refill on your cardiac medications before your next appointment, please call your pharmacy.   Lab work: Lipid and Liver: Fasting labs on 11/05/18  If you have labs (blood work) drawn today and your tests are completely normal, you will receive your results only by: Marland Kitchen MyChart Message (if you have MyChart) OR . A paper copy in the mail If you have any lab test that is abnormal or we need to change your treatment, we will call you to review the results.  Testing/Procedures: None  Follow-Up: At [facility], you and your health needs are our priority.  As part of our continuing mission to provide you with exceptional heart care, we have created designated Provider Care Teams.  These Care Teams include your primary Cardiologist (physician) and Advanced Practice Providers (APPs -  Physician Assistants and Nurse Practitioners) who all work together to provide you with the care you need, when you need it. You will need a follow up appointment in 1 years.  Please call our office 2 months in advance to schedule this appointment.  You may see Dr. Mayford Knife or one of the following Advanced Practice Providers on your designated Care Team:   [place], PA-C Ronie Spies, PA-C . Jacolyn Reedy, PA-C

## 2018-09-23 NOTE — Telephone Encounter (Signed)
Spoke with pt and obtained verbal consent for phone visit.    YOUR CARDIOLOGY TEAM HAS ARRANGED FOR AN E-VISIT FOR YOUR APPOINTMENT - PLEASE REVIEW IMPORTANT INFORMATION BELOW SEVERAL DAYS PRIOR TO YOUR APPOINTMENT  Due to the recent COVID-19 pandemic, we are transitioning in-person office visits to tele-medicine visits in an effort to decrease unnecessary exposure to our patients, their families, and staff. These visits are billed to your insurance just like a normal visit is. We also encourage you to sign up for MyChart if you have not already done so. You will need a smartphone if possible. For patients that do not have this, we can still complete the visit using a regular telephone but do prefer a smartphone to enable video when possible. You may have a family member that lives with you that can help. If possible, we also ask that you have a blood pressure cuff and scale at home to measure your blood pressure, heart rate and weight prior to your scheduled appointment. Patients with clinical needs that need an in-person evaluation and testing will still be able to come to the office if absolutely necessary. If you have any questions, feel free to call our office.     YOUR PROVIDER WILL BE USING THE FOLLOWING PLATFORM TO COMPLETE YOUR VISIT: [email or web address] . IF USING MYCHART - How to Download the MyChart App to Your SmartPhone   - If Apple, go to [employer] and type in MyChart in the search bar and download the app. If Android, ask patient to go to [employer] and type in [place] in the search bar and download the app. The app is free but as with any other app downloads, your phone may require you to verify saved payment information or Apple/Android password.  - You will need to then log into the app with your MyChart username and password, and select [facility] as your healthcare provider to link the account.  - When it is time for your visit, go to the MyChart app, find appointments, and click  Begin Video Visit. Be sure to Select Allow for your device to access the Microphone and Camera for your visit. You will then be connected, and your provider will be with you shortly.  **If you have any issues connecting or need assistance, please contact MyChart service desk [phone])**  **If using a computer, in order to ensure the best quality for your visit, you will need to use either of the following Internet Browsers: Agricultural consultant or [employer]**  . IF USING DOXIMITY or [email or web address] - The staff will give you instructions on receiving your link to join the meeting the day of your visit.      2-3 DAYS BEFORE YOUR APPOINTMENT  You will receive a telephone call from one of our HeartCare team members - your caller ID may say "Unknown caller." If this is a video visit, we will walk you through how to get the video launched on your phone. We will remind you check your blood pressure, heart rate and weight prior to your scheduled appointment. If you have an Apple Watch or Kardia, please upload any pertinent ECG strips the day before or morning of your appointment to MyChart. Our staff will also make sure you have reviewed the consent and agree to move forward with your scheduled tele-health visit.     THE DAY OF YOUR APPOINTMENT  Approximately 15 minutes prior to your scheduled appointment, you will receive a telephone call from one  of HeartCare team - your caller ID may say "Unknown caller."  Our staff will confirm medications, vital signs for the day and any symptoms you may be experiencing. Please have this information available prior to the time of visit start. It may also be helpful for you to have a pad of paper and pen handy for any instructions given during your visit. They will also walk you through joining the smartphone meeting if this is a video visit.    CONSENT FOR TELE-HEALTH VISIT - PLEASE REVIEW  I hereby voluntarily request, consent and authorize [facility] and its employed or contracted physicians, physician assistants, nurse practitioners or other licensed health care professionals (the Practitioner), to provide me with telemedicine health care services (the "Services") as deemed necessary by the treating Practitioner. I acknowledge and consent to receive the Services by the Practitioner via telemedicine. I understand that the telemedicine visit will involve communicating with the Practitioner through live audiovisual communication technology and the disclosure of certain medical information by electronic transmission. I acknowledge that I have been given the opportunity to request an in-person assessment or other available alternative prior to the telemedicine visit and am voluntarily participating in the telemedicine visit.  I understand that I have the right to withhold or withdraw my consent to the use of telemedicine in the course of my care at any time, without affecting my right to future care or treatment, and that the Practitioner or I may terminate the telemedicine visit at any time. I understand that I have the right to inspect all information obtained and/or recorded in the course of the telemedicine visit and may receive copies of available information for a reasonable fee.  I understand that some of the potential risks of receiving the Services via telemedicine include:  Marland Kitchen Delay or interruption in medical evaluation due to technological equipment failure or disruption; . Information transmitted may not be sufficient (e.g. poor resolution of images) to allow for appropriate medical decision making by the Practitioner; and/or  . In rare instances, security protocols could fail, causing a breach of personal health information.  Furthermore, I acknowledge that it is my responsibility to provide information about my medical history, conditions and care that is complete and accurate to the best of my ability. I acknowledge that Practitioner's  advice, recommendations, and/or decision may be based on factors not within their control, such as incomplete or inaccurate data provided by me or distortions of diagnostic images or specimens that may result from electronic transmissions. I understand that the practice of medicine is not an exact science and that Practitioner makes no warranties or guarantees regarding treatment outcomes. I acknowledge that I will receive a copy of this consent concurrently upon execution via email to the email address I last provided but may also request a printed copy by calling the office of [facility].    I understand that my insurance will be billed for this visit.   I have read or had this consent read to me. . I understand the contents of this consent, which adequately explains the benefits and risks of the Services being provided via telemedicine.  . I have been provided ample opportunity to ask questions regarding this consent and the Services and have had my questions answered to my satisfaction. . I give my informed consent for the services to be provided through the use of telemedicine in my medical care  By participating in this telemedicine visit I agree to the above.

## 2018-10-01 ENCOUNTER — Other Ambulatory Visit: Payer: Self-pay | Admitting: Physician Assistant

## 2018-10-23 ENCOUNTER — Other Ambulatory Visit: Payer: Self-pay | Admitting: Physician Assistant

## 2018-11-05 ENCOUNTER — Other Ambulatory Visit: Payer: Medicaid Other

## 2018-11-05 ENCOUNTER — Other Ambulatory Visit: Payer: Self-pay

## 2018-11-05 DIAGNOSIS — E7800 Pure hypercholesterolemia, unspecified: Secondary | ICD-10-CM

## 2018-11-06 LAB — LIPID PANEL
Chol/HDL Ratio: 2.1 ratio (ref 0.0–5.0)
Cholesterol, Total: 127 mg/dL (ref 100–199)
HDL: 61 mg/dL (ref >39)
LDL Calculated: 55 mg/dL (ref 0–99)
Triglycerides: 54 mg/dL (ref 0–149)
VLDL Cholesterol Cal: 11 mg/dL (ref 5–40)

## 2018-11-06 LAB — HEPATIC FUNCTION PANEL
ALT: 22 IU/L (ref 0–44)
AST: 29 IU/L (ref 0–40)
Albumin: 4.1 g/dL (ref 3.8–4.8)
Alkaline Phosphatase: 91 IU/L (ref 39–117)
Bilirubin Total: 0.4 mg/dL (ref 0.0–1.2)
Bilirubin, Direct: 0.11 mg/dL (ref 0.00–0.40)
Total Protein: 7.1 g/dL (ref 6.0–8.5)

## 2019-02-01 ENCOUNTER — Encounter (HOSPITAL_COMMUNITY): Payer: Self-pay | Admitting: Emergency Medicine

## 2019-02-01 ENCOUNTER — Inpatient Hospital Stay (HOSPITAL_COMMUNITY)
Admission: EM | Admit: 2019-02-01 | Discharge: 2019-02-03 | DRG: 065 | Disposition: A | Discharge status: Home / Self Care | Payer: Medicaid Other | Attending: Family Medicine | Admitting: Family Medicine

## 2019-02-01 ENCOUNTER — Other Ambulatory Visit: Payer: Self-pay

## 2019-02-01 DIAGNOSIS — I6381 Other cerebral infarction due to occlusion or stenosis of small artery: Principal | ICD-10-CM | POA: Diagnosis present

## 2019-02-01 DIAGNOSIS — D649 Anemia, unspecified: Secondary | ICD-10-CM | POA: Diagnosis present

## 2019-02-01 DIAGNOSIS — I4581 Long QT syndrome: Secondary | ICD-10-CM | POA: Diagnosis present

## 2019-02-01 DIAGNOSIS — E785 Hyperlipidemia, unspecified: Secondary | ICD-10-CM | POA: Diagnosis present

## 2019-02-01 DIAGNOSIS — Z91128 Patient's intentional underdosing of medication regimen for other reason: Secondary | ICD-10-CM

## 2019-02-01 DIAGNOSIS — E7800 Pure hypercholesterolemia, unspecified: Secondary | ICD-10-CM | POA: Diagnosis present

## 2019-02-01 DIAGNOSIS — Z8673 Personal history of transient ischemic attack (TIA), and cerebral infarction without residual deficits: Secondary | ICD-10-CM

## 2019-02-01 DIAGNOSIS — Z955 Presence of coronary angioplasty implant and graft: Secondary | ICD-10-CM

## 2019-02-01 DIAGNOSIS — Z886 Allergy status to analgesic agent status: Secondary | ICD-10-CM

## 2019-02-01 DIAGNOSIS — Z79899 Other long term (current) drug therapy: Secondary | ICD-10-CM

## 2019-02-01 DIAGNOSIS — Z7982 Long term (current) use of aspirin: Secondary | ICD-10-CM

## 2019-02-01 DIAGNOSIS — Z7902 Long term (current) use of antithrombotics/antiplatelets: Secondary | ICD-10-CM

## 2019-02-01 DIAGNOSIS — H3411 Central retinal artery occlusion, right eye: Secondary | ICD-10-CM | POA: Diagnosis present

## 2019-02-01 DIAGNOSIS — Z87891 Personal history of nicotine dependence: Secondary | ICD-10-CM

## 2019-02-01 DIAGNOSIS — F1021 Alcohol dependence, in remission: Secondary | ICD-10-CM

## 2019-02-01 DIAGNOSIS — Z20828 Contact with and (suspected) exposure to other viral communicable diseases: Secondary | ICD-10-CM | POA: Diagnosis present

## 2019-02-01 DIAGNOSIS — H5461 Unqualified visual loss, right eye, normal vision left eye: Secondary | ICD-10-CM

## 2019-02-01 DIAGNOSIS — F17200 Nicotine dependence, unspecified, uncomplicated: Secondary | ICD-10-CM | POA: Diagnosis present

## 2019-02-01 DIAGNOSIS — F1011 Alcohol abuse, in remission: Secondary | ICD-10-CM | POA: Diagnosis present

## 2019-02-01 DIAGNOSIS — I6521 Occlusion and stenosis of right carotid artery: Secondary | ICD-10-CM | POA: Diagnosis present

## 2019-02-01 DIAGNOSIS — I739 Peripheral vascular disease, unspecified: Secondary | ICD-10-CM | POA: Diagnosis present

## 2019-02-01 DIAGNOSIS — Z8249 Family history of ischemic heart disease and other diseases of the circulatory system: Secondary | ICD-10-CM

## 2019-02-01 DIAGNOSIS — F411 Generalized anxiety disorder: Secondary | ICD-10-CM | POA: Diagnosis present

## 2019-02-01 DIAGNOSIS — I10 Essential (primary) hypertension: Secondary | ICD-10-CM | POA: Diagnosis present

## 2019-02-01 DIAGNOSIS — I639 Cerebral infarction, unspecified: Secondary | ICD-10-CM

## 2019-02-01 DIAGNOSIS — T45526A Underdosing of antithrombotic drugs, initial encounter: Secondary | ICD-10-CM | POA: Diagnosis present

## 2019-02-01 DIAGNOSIS — H5347 Heteronymous bilateral field defects: Secondary | ICD-10-CM | POA: Diagnosis present

## 2019-02-01 DIAGNOSIS — I255 Ischemic cardiomyopathy: Secondary | ICD-10-CM | POA: Diagnosis present

## 2019-02-01 DIAGNOSIS — Z79891 Long term (current) use of opiate analgesic: Secondary | ICD-10-CM

## 2019-02-01 DIAGNOSIS — I252 Old myocardial infarction: Secondary | ICD-10-CM

## 2019-02-01 DIAGNOSIS — I2510 Atherosclerotic heart disease of native coronary artery without angina pectoris: Secondary | ICD-10-CM | POA: Diagnosis present

## 2019-02-01 DIAGNOSIS — J449 Chronic obstructive pulmonary disease, unspecified: Secondary | ICD-10-CM | POA: Diagnosis present

## 2019-02-01 DIAGNOSIS — Z95828 Presence of other vascular implants and grafts: Secondary | ICD-10-CM

## 2019-02-01 LAB — COMPREHENSIVE METABOLIC PANEL
ALT: 26 U/L (ref 0–44)
AST: 32 U/L (ref 15–41)
Albumin: 3.4 g/dL — ABNORMAL LOW (ref 3.5–5.0)
Alkaline Phosphatase: 68 U/L (ref 38–126)
Anion gap: 9 (ref 5–15)
BUN: 11 mg/dL (ref 8–23)
CO2: 25 mmol/L (ref 22–32)
Calcium: 9 mg/dL (ref 8.9–10.3)
Chloride: 105 mmol/L (ref 98–111)
Creatinine, Ser: 0.68 mg/dL (ref 0.61–1.24)
GFR calc Af Amer: >60 mL/min (ref >60)
GFR calc non Af Amer: >60 mL/min (ref >60)
Glucose, Bld: 85 mg/dL (ref 70–99)
Potassium: 4 mmol/L (ref 3.5–5.1)
Sodium: 139 mmol/L (ref 135–145)
Total Bilirubin: 0.3 mg/dL (ref 0.3–1.2)
Total Protein: 7 g/dL (ref 6.5–8.1)

## 2019-02-01 LAB — CBC WITH DIFFERENTIAL/PLATELET
Abs Immature Granulocytes: 0.02 10*3/uL (ref 0.00–0.07)
Basophils Absolute: 0 10*3/uL (ref 0.0–0.1)
Basophils Relative: 1 %
Eosinophils Absolute: 0.1 10*3/uL (ref 0.0–0.5)
Eosinophils Relative: 1 %
HCT: 34.8 % — ABNORMAL LOW (ref 39.0–52.0)
Hemoglobin: 11.5 g/dL — ABNORMAL LOW (ref 13.0–17.0)
Immature Granulocytes: 1 %
Lymphocytes Relative: 24 %
Lymphs Abs: 1.1 10*3/uL (ref 0.7–4.0)
MCH: 30 pg (ref 26.0–34.0)
MCHC: 33 g/dL (ref 30.0–36.0)
MCV: 90.9 fL (ref 80.0–100.0)
Monocytes Absolute: 0.3 10*3/uL (ref 0.1–1.0)
Monocytes Relative: 6 %
Neutro Abs: 3 10*3/uL (ref 1.7–7.7)
Neutrophils Relative %: 67 %
Platelets: 221 10*3/uL (ref 150–400)
RBC: 3.83 MIL/uL — ABNORMAL LOW (ref 4.22–5.81)
RDW: 13.2 % (ref 11.5–15.5)
WBC: 4.4 10*3/uL (ref 4.0–10.5)
nRBC: 0 % (ref 0.0–0.2)

## 2019-02-01 NOTE — ED Provider Notes (Signed)
11:13 PM Assumed care from Dr. Alvino Chapel, please see their note for full history, physical and decision making until this point. In brief this is a 63 y.o. year old male who presented to the ED tonight with Visual Field Change     Here with likely central artery occlusion vs vein occlusion. Phone consult with ophtho, will see in office if needed. More likely to be vascular in nature, pending MRI/MRA after curbside with neurology. Dr. Cheral Marker specifically stated the patient would need to be treated like a TIA with inpatient obs and workup but he would not need to be officially consulted.   MR with punctate acute ischemia. Dr. Cheral Marker consulted and will see. D/w medicine for admission.   Labs, studies and imaging reviewed by myself and considered in medical decision making if ordered. Imaging interpreted by radiology.  Labs Reviewed  SARS CORONAVIRUS 2 (TAT 6-24 HRS)  COMPREHENSIVE METABOLIC PANEL  CBC WITH DIFFERENTIAL/PLATELET    MR BRAIN WO CONTRAST    (Results Pending)  MR ANGIO HEAD WO CONTRAST    (Results Pending)    No follow-ups on file.    [name], Corene Cornea, MD 02/02/19 [phone]

## 2019-02-01 NOTE — ED Provider Notes (Signed)
[facility] DEPARTMENT Provider Note   CSN: [id number] Arrival date & time: 02/01/19  2056     History   Chief Complaint Chief Complaint  Patient presents with  . Visual Field Change    HPI [name] is a 63 y.o. male.     HPI Patient presents after vision change.  Last normal at 630.  At around that time he began to lose vision in his right eye.  Was blurry centrally and then turned to more black to the central and inferior part.  Feels fine otherwise.  No headache.  No confusion.  Has not had episodes like this before.  Known vascular disease. Past Medical History:  Diagnosis Date  . ANXIETY   . CARPAL TUNNEL SYNDROME, RIGHT   . COPD   . Coronary artery disease    s/p bare metal stens X 2 to the RCA, 09/28/2008 EF45-50%.  ath 01/2011 with patent RCA stents and moderate disese elsewhere  . DEGENERATIVE DISC DISEASE, CERVICAL SPINE, W/RADICULOPATHY   . DEPRESSION   . GANGLION CYST, WRIST, LEFT   . HYPERCHOLESTEROLEMIA   . HYPERGLYCEMIA   . Hypertension   . LUNG NODULES   . MI   . PERIPHERAL VASCULAR DISEASE WITH CLAUDICATION, LEFT LEG   . PLANTAR FASCIITIS, LEFT   . REACTIVE AIRWAY DISEASE   . ROTATOR CUFF SYNDROME, LEFT   . TESTICULAR HYPOFUNCTION   . THYROMEGALY     Patient Active Problem List   Diagnosis Date Noted  . TESTICULAR HYPOFUNCTION 04/29/2010  . UPPER RESPIRATORY INFECTION, ACUTE 01/06/2010  . FATIGUE 01/06/2010  . COUGH 01/06/2010  . COPD 10/29/2009  . ERECTILE DYSFUNCTION, ORGANIC 05/18/2009  . LEG CRAMPS 05/18/2009  . ANKLE PAIN, RIGHT 02/26/2009  . MI 09/27/2008  . Coronary atherosclerosis 09/27/2008  . PERIPHERAL VASCULAR DISEASE WITH CLAUDICATION, LEFT LEG 08/28/2008  . DEGENERATIVE DISC DISEASE, LUMBOSACRAL SPINE 08/28/2008  . LEG PAIN, BILATERAL 08/18/2008  . THYROMEGALY 06/26/2008  . GANGLION CYST, WRIST, LEFT 06/26/2008  . HYPERGLYCEMIA 06/26/2008  . HYPERCHOLESTEROLEMIA 06/06/2008  . CARPAL  TUNNEL SYNDROME, RIGHT 09/12/2007  . Essential hypertension 09/12/2007  . PAIN IN JOINT PELVIC REGION AND THIGH 05/23/2007  . PLANTAR FASCIITIS, LEFT 05/23/2007  . DEPRESSION 04/07/2007  . REACTIVE AIRWAY DISEASE 01/21/2007  . ABSCESS, TOOTH 01/21/2007  . SKIN RASH 01/21/2007  . ANXIETY 01/05/2007  . TOBACCO ABUSE 01/05/2007  . INSOMNIA 01/05/2007  . ALCOHOL ABUSE, HX OF 01/05/2007  . LIVER FUNCTION TESTS, ABNORMAL, HX OF 01/05/2007  . DEGENERATIVE DISC DISEASE, CERVICAL SPINE, W/RADICULOPATHY 05/04/2006  . ROTATOR CUFF SYNDROME, LEFT 05/04/2006  . LUNG NODULES 01/02/2006    Past Surgical History:  Procedure Laterality Date  . ANGIOPLASTY / STENTING FEMORAL    . CARDIAC CATHETERIZATION     01/2011 cardiac cath with patent stents and normal EF        Home Medications    Prior to Admission medications   Medication Sig Start Date End Date Taking? Authorizing Provider  amphetamine-dextroamphetamine (ADDERALL) 15 MG tablet Take 15 mg by mouth daily.   Yes [provider]  Ascorbic Acid (VITAMIN C) 1000 MG tablet Take 1,000 mg by mouth at bedtime.    Yes [provider]  aspirin 81 MG tablet Take 1 tablet (81 mg total) by mouth every morning. Patient taking differently: Take 81 mg by mouth at bedtime.  04/07/14  Yes Turner, Eber Hong, MD  atorvastatin (LIPITOR) 80 MG tablet TAKE 1 TABLET(80 MG) BY MOUTH DAILY  Patient taking differently: Take 80 mg by mouth at bedtime.  09/23/18  Yes Turner, Cornelious Bryant, MD  B Complex Vitamins (VITAMIN B-COMPLEX PO) Take 1 tablet by mouth daily.   Yes [provider]  cholecalciferol (VITAMIN D) 1000 UNITS tablet Take 1,000 Units by mouth every evening.     Yes [provider]  clopidogrel (PLAVIX) 75 MG tablet TAKE 1 TABLET(75 MG) BY MOUTH DAILY Patient taking differently: Take 75 mg by mouth daily.  12/19/17  Yes Dyann Kief, PA-C  famotidine (PEPCID) 10 MG tablet Take 10 mg by mouth as needed for heartburn or  indigestion.   Yes [provider]  gabapentin (NEURONTIN) 600 MG tablet Take 600 mg by mouth 3 (three) times daily.     Yes [provider]  isosorbide mononitrate (IMDUR) 30 MG 24 hr tablet TAKE 1 TABLET BY MOUTH EVERY DAY Patient taking differently: Take 30 mg by mouth daily.  10/01/18  Yes Turner, Cornelious Bryant, MD  metoprolol tartrate (LOPRESSOR) 50 MG tablet TAKE 1/2 TABLET(25 MG) BY MOUTH TWICE DAILY Patient taking differently: Take 25 mg by mouth 2 (two) times daily.  10/23/18  Yes Dyann Kief, PA-C  nitroGLYCERIN (NITROSTAT) 0.6 MG SL tablet PLACE 1 TABLET UNDER THE TONGUE EVERY 5 MINUTES AS NEEDED FOR CHEST PAIN Patient taking differently: Place 0.6 mg under the tongue every 5 (five) minutes as needed for chest pain.  09/23/18  Yes Turner, Cornelious Bryant, MD  SUBOXONE 8-2 MG FILM Place 0.5-1 application under the tongue daily. 1 in the morning and 1/2 in the afternoon 09/22/14  Yes [provider]    Family History Family History  Problem Relation Age of Onset  . Heart failure Father   . Diabetes Father     Social History Social History   Tobacco Use  . Smoking status: Former Games developer  . Smokeless tobacco: Never Used  Substance Use Topics  . Alcohol use: No  . Drug use: No     Allergies   Nsaids   Review of Systems Review of Systems  Constitutional: Negative for appetite change.  HENT: Negative for congestion.   Eyes: Positive for visual disturbance.  Respiratory: Negative for shortness of breath.   Cardiovascular: Negative for chest pain.  Gastrointestinal: Negative for abdominal pain.  Genitourinary: Negative for flank pain.  Musculoskeletal: Negative for back pain.  Skin: Negative for rash.  Neurological: Negative for weakness.     Physical Exam Updated Vital Signs BP (!) 158/78   Pulse 76   Temp 97.8 F (36.6 C) (Oral)   Resp 14   SpO2 99%   Physical Exam Vitals signs and nursing note reviewed.  HENT:     Head: Normocephalic.   Eyes:     Extraocular Movements: Extraocular movements intact.     Comments: Pupils reactive.  Decreased vision centrally on right eye and inferiorly on visual field confrontation.  Normal vision on left eye.  Neck:     Musculoskeletal: Neck supple.  Cardiovascular:     Rate and Rhythm: Regular rhythm.  Pulmonary:     Breath sounds: Normal breath sounds.  Abdominal:     Tenderness: There is no abdominal tenderness.  Musculoskeletal:     Right lower leg: No edema.     Left lower leg: No edema.  Skin:    General: Skin is warm.     Capillary Refill: Capillary refill takes less than 2 seconds.  Neurological:     Mental Status: He is alert and  oriented to person, place, and time.  Psychiatric:        Mood and Affect: Mood normal.      ED Treatments / Results  Labs (all labs ordered are listed, but only abnormal results are displayed) Labs Reviewed  SARS CORONAVIRUS 2 (TAT 6-24 HRS)  COMPREHENSIVE METABOLIC PANEL  CBC WITH DIFFERENTIAL/PLATELET    EKG EKG Interpretation  Date/Time:  Saturday February 01 2019 22:55:05 EDT Ventricular Rate:  66 PR Interval:    QRS Duration: 77 QT Interval:  457 QTC Calculation: 479 R Axis:   75 Text Interpretation:  Sinus rhythm Borderline prolonged QT interval Confirmed by Benjiman CorePickering, Theodoros [phone]([id number]) on 02/01/2019 10:59:47 PM   Radiology No results found.  Procedures Procedures (including critical care time)  Medications Ordered in ED Medications - No data to display   Initial Impression / Assessment and Plan / ED Course  I have reviewed the triage vital signs and the nursing notes.  Pertinent labs & imaging results that were available during my care of the patient were reviewed by me and considered in my medical decision making (see chart for details).        Patient with acute vision loss.  Painless.  Central vision on the right and somewhat inferior.  Potential central retinal artery central retinal vein occlusion.  Also  has previous vascular issues potentially could be stroke.  MRI to be done.  If negative for stroke neurology does not need to be consulted.  If does show a stroke neurology can be consulted otherwise will need work-up for potential embolic cause of arterial occlusion.  Ocular massage has been done also. Discussed with Dr. Georges Mousehristopher Shah.  Can follow-up on Monday in the office..   Final Clinical Impressions(s) / ED Diagnoses   Final diagnoses:  Vision loss, right eye    ED Discharge Orders    None       Benjiman CorePickering, [place], MD 02/01/19 2319

## 2019-02-01 NOTE — ED Notes (Signed)
PT reports sudden onset of blurred vision to R eye at 6:30pm.  States vision initially blurred and then turned black.  Pt has peripheral vision and can track without difficulty but states he only sees black in R eye if looking straight forward.  No arm drift or other neuro deficits noted on exam.  Pt straight to bridge for Dr. Alvino Chapel to assess.

## 2019-02-01 NOTE — ED Triage Notes (Signed)
Pt c/o blurred vision to the right eye that started at [place]. Pt states he lost vision to his right eye, but can still see peripherally. Denies headache, no weakness/drift/facial droop noted. Pt A&O x4, ambulatory without difficulty.

## 2019-02-02 ENCOUNTER — Emergency Department (HOSPITAL_COMMUNITY): Payer: Medicaid Other

## 2019-02-02 ENCOUNTER — Inpatient Hospital Stay (HOSPITAL_COMMUNITY): Payer: Medicaid Other

## 2019-02-02 DIAGNOSIS — Z20828 Contact with and (suspected) exposure to other viral communicable diseases: Secondary | ICD-10-CM | POA: Diagnosis present

## 2019-02-02 DIAGNOSIS — H3411 Central retinal artery occlusion, right eye: Secondary | ICD-10-CM | POA: Diagnosis present

## 2019-02-02 DIAGNOSIS — Z8249 Family history of ischemic heart disease and other diseases of the circulatory system: Secondary | ICD-10-CM | POA: Diagnosis not present

## 2019-02-02 DIAGNOSIS — I2510 Atherosclerotic heart disease of native coronary artery without angina pectoris: Secondary | ICD-10-CM | POA: Diagnosis present

## 2019-02-02 DIAGNOSIS — F1011 Alcohol abuse, in remission: Secondary | ICD-10-CM | POA: Diagnosis present

## 2019-02-02 DIAGNOSIS — I361 Nonrheumatic tricuspid (valve) insufficiency: Secondary | ICD-10-CM

## 2019-02-02 DIAGNOSIS — I639 Cerebral infarction, unspecified: Secondary | ICD-10-CM

## 2019-02-02 DIAGNOSIS — E7800 Pure hypercholesterolemia, unspecified: Secondary | ICD-10-CM | POA: Diagnosis present

## 2019-02-02 DIAGNOSIS — I255 Ischemic cardiomyopathy: Secondary | ICD-10-CM | POA: Diagnosis present

## 2019-02-02 DIAGNOSIS — I6521 Occlusion and stenosis of right carotid artery: Secondary | ICD-10-CM | POA: Diagnosis present

## 2019-02-02 DIAGNOSIS — F411 Generalized anxiety disorder: Secondary | ICD-10-CM | POA: Diagnosis present

## 2019-02-02 DIAGNOSIS — I252 Old myocardial infarction: Secondary | ICD-10-CM | POA: Diagnosis not present

## 2019-02-02 DIAGNOSIS — J449 Chronic obstructive pulmonary disease, unspecified: Secondary | ICD-10-CM | POA: Diagnosis present

## 2019-02-02 DIAGNOSIS — H5461 Unqualified visual loss, right eye, normal vision left eye: Secondary | ICD-10-CM | POA: Diagnosis present

## 2019-02-02 DIAGNOSIS — D649 Anemia, unspecified: Secondary | ICD-10-CM | POA: Diagnosis present

## 2019-02-02 DIAGNOSIS — Z91128 Patient's intentional underdosing of medication regimen for other reason: Secondary | ICD-10-CM | POA: Diagnosis not present

## 2019-02-02 DIAGNOSIS — Z87891 Personal history of nicotine dependence: Secondary | ICD-10-CM | POA: Diagnosis not present

## 2019-02-02 DIAGNOSIS — I739 Peripheral vascular disease, unspecified: Secondary | ICD-10-CM | POA: Diagnosis present

## 2019-02-02 DIAGNOSIS — Z8673 Personal history of transient ischemic attack (TIA), and cerebral infarction without residual deficits: Secondary | ICD-10-CM | POA: Diagnosis not present

## 2019-02-02 DIAGNOSIS — E785 Hyperlipidemia, unspecified: Secondary | ICD-10-CM | POA: Diagnosis present

## 2019-02-02 DIAGNOSIS — I6381 Other cerebral infarction due to occlusion or stenosis of small artery: Secondary | ICD-10-CM | POA: Diagnosis present

## 2019-02-02 DIAGNOSIS — Z95828 Presence of other vascular implants and grafts: Secondary | ICD-10-CM | POA: Diagnosis not present

## 2019-02-02 DIAGNOSIS — H5347 Heteronymous bilateral field defects: Secondary | ICD-10-CM | POA: Diagnosis present

## 2019-02-02 DIAGNOSIS — Z955 Presence of coronary angioplasty implant and graft: Secondary | ICD-10-CM | POA: Diagnosis not present

## 2019-02-02 DIAGNOSIS — I4581 Long QT syndrome: Secondary | ICD-10-CM | POA: Diagnosis present

## 2019-02-02 DIAGNOSIS — I10 Essential (primary) hypertension: Secondary | ICD-10-CM | POA: Diagnosis present

## 2019-02-02 DIAGNOSIS — T45526A Underdosing of antithrombotic drugs, initial encounter: Secondary | ICD-10-CM | POA: Diagnosis present

## 2019-02-02 LAB — CREATININE, SERUM
Creatinine, Ser: 0.71 mg/dL (ref 0.61–1.24)
GFR calc Af Amer: >60 mL/min (ref >60)
GFR calc non Af Amer: >60 mL/min (ref >60)

## 2019-02-02 LAB — CBC
HCT: 35.3 % — ABNORMAL LOW (ref 39.0–52.0)
Hemoglobin: 11.9 g/dL — ABNORMAL LOW (ref 13.0–17.0)
MCH: 30 pg (ref 26.0–34.0)
MCHC: 33.7 g/dL (ref 30.0–36.0)
MCV: 88.9 fL (ref 80.0–100.0)
Platelets: 214 10*3/uL (ref 150–400)
RBC: 3.97 MIL/uL — ABNORMAL LOW (ref 4.22–5.81)
RDW: 13.1 % (ref 11.5–15.5)
WBC: 4.5 10*3/uL (ref 4.0–10.5)
nRBC: 0 % (ref 0.0–0.2)

## 2019-02-02 LAB — LIPID PANEL
Cholesterol: 130 mg/dL (ref 0–200)
HDL: 62 mg/dL (ref >40)
LDL Cholesterol: 58 mg/dL (ref 0–99)
Total CHOL/HDL Ratio: 2.1 RATIO
Triglycerides: 48 mg/dL (ref <150)
VLDL: 10 mg/dL (ref 0–40)

## 2019-02-02 LAB — HEMOGLOBIN A1C
Hgb A1c MFr Bld: 5.7 % — ABNORMAL HIGH (ref 4.8–5.6)
Mean Plasma Glucose: 116.89 mg/dL

## 2019-02-02 LAB — ECHOCARDIOGRAM COMPLETE

## 2019-02-02 LAB — HIV ANTIBODY (ROUTINE TESTING W REFLEX): HIV Screen 4th Generation wRfx: NONREACTIVE

## 2019-02-02 LAB — SARS CORONAVIRUS 2 (TAT 6-24 HRS): SARS Coronavirus 2: NEGATIVE

## 2019-02-02 MED ORDER — ENOXAPARIN SODIUM 40 MG/0.4ML ~~LOC~~ SOLN
40.0000 mg | SUBCUTANEOUS | Status: DC
  Administered 2019-02-02 – 2019-02-03 (×2): 40 mg via SUBCUTANEOUS
  Filled 2019-02-02 (×2): qty 0.4

## 2019-02-02 MED ORDER — ACETAMINOPHEN 650 MG RE SUPP: 650.0000 mg | RECTAL | Status: DC | PRN

## 2019-02-02 MED ORDER — SENNOSIDES-DOCUSATE SODIUM 8.6-50 MG PO TABS: 1.0000 | ORAL_TABLET | Freq: Every evening | ORAL | Status: DC | PRN

## 2019-02-02 MED ORDER — ISOSORBIDE MONONITRATE ER 30 MG PO TB24
30.0000 mg | ORAL_TABLET | Freq: Every day | ORAL | Status: DC
  Administered 2019-02-02: 09:00:00 30 mg via ORAL
  Filled 2019-02-02: qty 1

## 2019-02-02 MED ORDER — ATORVASTATIN CALCIUM 80 MG PO TABS
80.0000 mg | ORAL_TABLET | Freq: Every day | ORAL | Status: DC
  Administered 2019-02-02: 80 mg via ORAL
  Filled 2019-02-02: qty 1

## 2019-02-02 MED ORDER — BUPRENORPHINE HCL-NALOXONE HCL 8-2 MG SL SUBL
1.0000 | SUBLINGUAL_TABLET | Freq: Every day | SUBLINGUAL | Status: DC
  Administered 2019-02-02 – 2019-02-03 (×2): 1 via SUBLINGUAL
  Filled 2019-02-02 (×2): qty 1

## 2019-02-02 MED ORDER — GABAPENTIN 300 MG PO CAPS
600.0000 mg | ORAL_CAPSULE | Freq: Three times a day (TID) | ORAL | Status: DC
  Administered 2019-02-02 – 2019-02-03 (×4): 600 mg via ORAL
  Filled 2019-02-02 (×4): qty 2

## 2019-02-02 MED ORDER — STROKE: EARLY STAGES OF RECOVERY BOOK
Freq: Once | Status: AC
  Administered 2019-02-02: 07:00:00
  Filled 2019-02-02: qty 1

## 2019-02-02 MED ORDER — AMPHETAMINE-DEXTROAMPHETAMINE 10 MG PO TABS
15.0000 mg | ORAL_TABLET | Freq: Every day | ORAL | Status: DC
  Administered 2019-02-02 – 2019-02-03 (×2): 15 mg via ORAL
  Filled 2019-02-02 (×2): qty 2

## 2019-02-02 MED ORDER — B COMPLEX-C PO TABS
1.0000 | ORAL_TABLET | Freq: Every day | ORAL | Status: DC
  Administered 2019-02-02 – 2019-02-03 (×2): 1 via ORAL
  Filled 2019-02-02 (×2): qty 1

## 2019-02-02 MED ORDER — CLOPIDOGREL BISULFATE 75 MG PO TABS
75.0000 mg | ORAL_TABLET | Freq: Every day | ORAL | Status: DC
  Administered 2019-02-02 – 2019-02-03 (×2): 75 mg via ORAL
  Filled 2019-02-02 (×2): qty 1

## 2019-02-02 MED ORDER — METOPROLOL TARTRATE 25 MG PO TABS
25.0000 mg | ORAL_TABLET | Freq: Two times a day (BID) | ORAL | Status: DC
  Administered 2019-02-02 – 2019-02-03 (×3): 25 mg via ORAL
  Filled 2019-02-02 (×3): qty 1

## 2019-02-02 MED ORDER — VITAMIN D 25 MCG (1000 UNIT) PO TABS
1000.0000 [IU] | ORAL_TABLET | Freq: Every evening | ORAL | Status: DC
  Administered 2019-02-02: 17:00:00 1000 [IU] via ORAL
  Filled 2019-02-02: qty 1

## 2019-02-02 MED ORDER — VITAMIN C 500 MG PO TABS
1000.0000 mg | ORAL_TABLET | Freq: Every day | ORAL | Status: DC
  Administered 2019-02-02: 1000 mg via ORAL
  Filled 2019-02-02: qty 2

## 2019-02-02 MED ORDER — ACETAMINOPHEN 160 MG/5ML PO SOLN: 650.0000 mg | ORAL | Status: DC | PRN

## 2019-02-02 MED ORDER — NITROGLYCERIN 0.4 MG SL SUBL: 0.4000 mg | SUBLINGUAL_TABLET | SUBLINGUAL | Status: DC | PRN

## 2019-02-02 MED ORDER — ASPIRIN EC 81 MG PO TBEC
81.0000 mg | DELAYED_RELEASE_TABLET | Freq: Every day | ORAL | Status: DC
  Administered 2019-02-02: 22:00:00 81 mg via ORAL
  Filled 2019-02-02: qty 1

## 2019-02-02 MED ORDER — ACETAMINOPHEN 325 MG PO TABS: 650.0000 mg | ORAL_TABLET | ORAL | Status: DC | PRN

## 2019-02-02 MED ORDER — SODIUM CHLORIDE 0.9 % IV SOLN
INTRAVENOUS | Status: DC
  Administered 2019-02-02 – 2019-02-03 (×2): via INTRAVENOUS

## 2019-02-02 MED ORDER — FAMOTIDINE 10 MG PO TABS: 10.0000 mg | ORAL_TABLET | Freq: Every day | ORAL | Status: DC | PRN

## 2019-02-02 NOTE — ED Notes (Signed)
Report called to rn on 3000 

## 2019-02-02 NOTE — ED Notes (Signed)
Pt returned from mri

## 2019-02-02 NOTE — Progress Notes (Signed)
VASCULAR LAB PRELIMINARY  PRELIMINARY  PRELIMINARY  PRELIMINARY  Carotid duplex completed.    Preliminary report:  See CV proc for preliminary results.  [place], RVT 02/02/2019, 10:27 AM

## 2019-02-02 NOTE — ED Notes (Signed)
Pt passed the swallow sceen

## 2019-02-02 NOTE — H&P (Signed)
History and Physical   [name]:[id number] DOB: 22-Oct-1955 DOA: 02/01/2019  Referring MD/NP/PA: Dr. Clayborne DanaMesner  PCP: [name], Jennifer, PA-C   Outpatient Specialists: None  Patient coming from: Home  Chief Complaint: Sudden visual loss  HPI: [name] is a 63 y.o. male with medical history significant of coronary artery disease, status post stents to RCA in June 2010, ischemic cardiomyopathy EF of 45 to 50%, hyperlipidemia, hypertension, tobacco abuse, history of alcohol abuse, COPD who presents to the ER with sudden loss of vision in the right eye that started about 630 last evening.  He was sitting around eating chips when he suddenly noted blurry vision in the right eye.  It started more in the central region then turned to more black to the central and then inferior part.  This change not improved.  He was worried and came to the ER.  Patient is found to have hemianopsia and is being admitted after MRI confirmed a stroke.  He was initially seen by ophthalmology Dr. Georges Mousehristopher Shah who did ocular message and recommended MRI.  Now that MRI confirmed a stroke neurology is consulted and patient is being admitted to the hospital.  ED Course: Temperature is 9078 blood pressure 158/78 pulse 80 respiratory 25 oxygen sats 99% on room air.  Chemistry and CBC appear to be within normal except for mild albumin dropped to 3.4 and hemoglobin 11.5.  COVID-19 screen is negative.  MRI of the brain showed 2 punctate foci of acute ischemia at the right carotid body no hemorrhage or mass-effect.  There is old right frontal subcortical infarct and mild chronic small vessel disease.  There is severe stenosis of the right P1 P2 junction.  She has been admitted with acute stroke  Review of Systems: As per HPI otherwise 10 point review of systems negative.    Past Medical History:  Diagnosis Date   ANXIETY    CARPAL TUNNEL SYNDROME, RIGHT    COPD    Coronary artery disease    s/p bare  metal stens X 2 to the RCA, 09/28/2008 EF45-50%.  ath 01/2011 with patent RCA stents and moderate disese elsewhere   DEGENERATIVE DISC DISEASE, CERVICAL SPINE, W/RADICULOPATHY    DEPRESSION    GANGLION CYST, WRIST, LEFT    HYPERCHOLESTEROLEMIA    HYPERGLYCEMIA    Hypertension    LUNG NODULES    MI    PERIPHERAL VASCULAR DISEASE WITH CLAUDICATION, LEFT LEG    PLANTAR FASCIITIS, LEFT    REACTIVE AIRWAY DISEASE    ROTATOR CUFF SYNDROME, LEFT    TESTICULAR HYPOFUNCTION    THYROMEGALY     Past Surgical History:  Procedure Laterality Date   ANGIOPLASTY / STENTING FEMORAL     CARDIAC CATHETERIZATION     01/2011 cardiac cath with patent stents and normal EF     reports that he has quit smoking. He has never used smokeless tobacco. He reports that he does not drink alcohol or use drugs.  Allergies  Allergen Reactions   Nsaids Other (See Comments)    REACTION: Not a true allergy--GI upset with some.    Family History  Problem Relation Age of Onset   Heart failure Father    Diabetes Father      Prior to Admission medications   Medication Sig Start Date End Date Taking? Authorizing Provider  amphetamine-dextroamphetamine (ADDERALL) 15 MG tablet Take 15 mg by mouth daily.   Yes [provider]  Ascorbic Acid (VITAMIN C) 1000 MG tablet Take  1,000 mg by mouth at bedtime.    Yes [provider]  aspirin 81 MG tablet Take 1 tablet (81 mg total) by mouth every morning. Patient taking differently: Take 81 mg by mouth at bedtime.  04/07/14  Yes Turner, Cornelious Bryant, MD  atorvastatin (LIPITOR) 80 MG tablet TAKE 1 TABLET(80 MG) BY MOUTH DAILY Patient taking differently: Take 80 mg by mouth at bedtime.  09/23/18  Yes Turner, Cornelious Bryant, MD  B Complex Vitamins (VITAMIN B-COMPLEX PO) Take 1 tablet by mouth daily.   Yes [provider]  cholecalciferol (VITAMIN D) 1000 UNITS tablet Take 1,000 Units by mouth every evening.     Yes [provider]    clopidogrel (PLAVIX) 75 MG tablet TAKE 1 TABLET(75 MG) BY MOUTH DAILY Patient taking differently: Take 75 mg by mouth daily.  12/19/17  Yes Dyann Kief, PA-C  famotidine (PEPCID) 10 MG tablet Take 10 mg by mouth as needed for heartburn or indigestion.   Yes [provider]  gabapentin (NEURONTIN) 600 MG tablet Take 600 mg by mouth 3 (three) times daily.     Yes [provider]  isosorbide mononitrate (IMDUR) 30 MG 24 hr tablet TAKE 1 TABLET BY MOUTH EVERY DAY Patient taking differently: Take 30 mg by mouth daily.  10/01/18  Yes Turner, Cornelious Bryant, MD  metoprolol tartrate (LOPRESSOR) 50 MG tablet TAKE 1/2 TABLET(25 MG) BY MOUTH TWICE DAILY Patient taking differently: Take 25 mg by mouth 2 (two) times daily.  10/23/18  Yes Dyann Kief, PA-C  nitroGLYCERIN (NITROSTAT) 0.6 MG SL tablet PLACE 1 TABLET UNDER THE TONGUE EVERY 5 MINUTES AS NEEDED FOR CHEST PAIN Patient taking differently: Place 0.6 mg under the tongue every 5 (five) minutes as needed for chest pain.  09/23/18  Yes Turner, Cornelious Bryant, MD  SUBOXONE 8-2 MG FILM Place 0.5-1 application under the tongue daily. 1 in the morning and 1/2 in the afternoon 09/22/14  Yes [provider]    Physical Exam: Vitals:   02/01/19 2130 02/01/19 2300 02/01/19 2345 02/02/19 0217  BP: (!) 158/78 140/76 130/73 130/75  Pulse: 76 65 61 62  Resp: Temp:      TempSrc:      SpO2: 99% 95% 97% 96%      Constitutional: NAD, anxious comfortable Vitals:   02/01/19 2130 02/01/19 2300 02/01/19 2345 02/02/19 0217  BP: (!) 158/78 140/76 130/73 130/75  Pulse: 76 65 61 62  Resp: Temp:      TempSrc:      SpO2: 99% 95% 97% 96%   Eyes: PERRL, lids and conjunctivae normal, no evidence of ptosis ENMT: Mucous membranes are moist. Posterior pharynx clear of any exudate or lesions.Normal dentition.  Neck: normal, supple, no masses, no thyromegaly Respiratory: clear to auscultation bilaterally, no wheezing, no crackles.  Normal respiratory effort. No accessory muscle use.  Cardiovascular: Regular rate and rhythm, no murmurs / rubs / gallops. No extremity edema. 2+ pedal pulses. No carotid bruits.  Abdomen: no tenderness, no masses palpated. No hepatosplenomegaly. Bowel sounds positive.  Musculoskeletal: no clubbing / cyanosis. No joint deformity upper and lower extremities. Good ROM, no contractures. Normal muscle tone.  Skin: no rashes, lesions, ulcers. No induration Neurologic: CN 2-12 grossly intact. Sensation intact, DTR normal. Strength 5/5 in all 4.  Right, numerous hemianopsia Psychiatric: Normal judgment and insight. Alert and oriented x 3. Normal mood.     Labs on Admission: I have personally  reviewed following labs and imaging studies  CBC: Recent Labs  Lab 02/01/19 2250  WBC 4.4  NEUTROABS 3.0  HGB 11.5*  HCT 34.8*  MCV 90.9  PLT 221   Basic Metabolic Panel: Recent Labs  Lab 02/01/19 2250  NA 139  K 4.0  CL 105  CO2 25  GLUCOSE 85  BUN 11  CREATININE 0.68  CALCIUM 9.0   GFR: CrCl cannot be calculated (Unknown ideal weight.). Liver Function Tests: Recent Labs  Lab 02/01/19 2250  AST 32  ALT 26  ALKPHOS 68  BILITOT 0.3  PROT 7.0  ALBUMIN 3.4*   No results for input(s): LIPASE, AMYLASE in the last 168 hours. No results for input(s): AMMONIA in the last 168 hours. Coagulation Profile: No results for input(s): INR, PROTIME in the last 168 hours. Cardiac Enzymes: No results for input(s): CKTOTAL, CKMB, CKMBINDEX, TROPONINI in the last 168 hours. BNP (last 3 results) No results for input(s): PROBNP in the last 8760 hours. HbA1C: No results for input(s): HGBA1C in the last 72 hours. CBG: No results for input(s): GLUCAP in the last 168 hours. Lipid Profile: No results for input(s): CHOL, HDL, LDLCALC, TRIG, CHOLHDL, LDLDIRECT in the last 72 hours. Thyroid Function Tests: No results for input(s): TSH, T4TOTAL, FREET4, T3FREE, THYROIDAB in the last 72 hours. Anemia  Panel: No results for input(s): VITAMINB12, FOLATE, FERRITIN, TIBC, IRON, RETICCTPCT in the last 72 hours. Urine analysis:    Component Value Date/Time   COLORURINE lt. yellow 04/29/2010 0930   APPEARANCEUR Clear 04/29/2010 0930   LABSPEC <1.005 04/29/2010 0930   PHURINE 5.0 04/29/2010 0930   GLUCOSEU NEGATIVE 04/10/2009 1106   HGBUR trace-intact 04/29/2010 0930   BILIRUBINUR negative 04/29/2010 0930   KETONESUR NEGATIVE 04/10/2009 1106   PROTEINUR NEGATIVE 04/10/2009 1106   UROBILINOGEN 0.2 04/29/2010 0930   NITRITE negative 04/29/2010 0930   LEUKOCYTESUR  04/10/2009 1106    NEGATIVE MICROSCOPIC NOT DONE ON URINES WITH NEGATIVE PROTEIN, BLOOD, LEUKOCYTES, NITRITE, OR GLUCOSE <1000 mg/dL.   Sepsis Labs: (procalcitonin:4,lacticidven:4) ) Recent Results (from the past 240 hour(s))  SARS CORONAVIRUS 2 (TAT 6-24 HRS) Nasopharyngeal Nasopharyngeal Swab     Status: None   Collection Time: 02/02/19 12:01 AM   Specimen: Nasopharyngeal Swab  Result Value Ref Range Status   SARS Coronavirus 2 NEGATIVE NEGATIVE Final    Comment: (NOTE) SARS-CoV-2 target nucleic acids are NOT DETECTED. The SARS-CoV-2 RNA is generally detectable in upper and lower respiratory specimens during the acute phase of infection. Negative results do not preclude SARS-CoV-2 infection, do not rule out co-infections with other pathogens, and should not be used as the sole basis for treatment or other patient management decisions. Negative results must be combined with clinical observations, patient history, and epidemiological information. The expected result is Negative. Fact Sheet for Patients: HairSlick.no Fact Sheet for Healthcare Providers: quierodirigir.com This test is not yet approved or cleared by the [place] FDA and  has been authorized for detection and/or diagnosis of SARS-CoV-2 by FDA under an Emergency Use Authorization (EUA). This  EUA will remain  in effect (meaning this test can be used) for the duration of the COVID-19 declaration under Section 56 4(b)(1) of the Act, 21 U.S.C. section 360bbb-3(b)(1), unless the authorization is terminated or revoked sooner. Performed at [facility], [address] [place], [place] [address]      Radiological Exams on Admission: Mr Angio Head Wo Contrast  Result Date: 02/02/2019 CLINICAL DATA:  Vision loss EXAM: MRI HEAD WITHOUT  CONTRAST MRA HEAD WITHOUT CONTRAST TECHNIQUE: Multiplanar, multiecho pulse sequences of the brain and surrounding structures were obtained without intravenous contrast. Angiographic images of the head were obtained using MRA technique without contrast. COMPARISON:  None. FINDINGS: MRI HEAD FINDINGS BRAIN: There are 2 punctate foci of abnormal diffusion restriction at the right caudate body. Multifocal white matter hyperintensity, most commonly due to chronic ischemic microangiopathy. There is an old right frontal subcortical infarct. The cerebral and cerebellar volume are age-appropriate. There is no hydrocephalus. The midline structures are normal. VASCULAR: The major intracranial arterial and venous sinus flow voids are normal. Susceptibility-sensitive sequences show no chronic microhemorrhage or superficial siderosis. SKULL AND UPPER CERVICAL SPINE: Calvarial bone marrow signal is normal. There is no skull base mass. The visualized upper cervical spine and soft tissues are normal. SINUSES/ORBITS: There are no fluid levels or advanced mucosal thickening. The mastoid air cells and middle ear cavities are free of fluid. The orbits are normal. MRA HEAD FINDINGS POSTERIOR CIRCULATION: --Vertebral arteries: Normal right V4 segment. Diminutive left. --Posterior inferior cerebellar arteries (PICA): Patent origins from the vertebral arteries. --Anterior inferior cerebellar arteries (AICA): Patent origins from the basilar artery. --Basilar artery: Normal. --Superior  cerebellar arteries: Normal. --Posterior cerebral arteries: Bilateral diminutive posterior communicating arteries. Severe stenosis of the right P1-2 junction. Otherwise normal. ANTERIOR CIRCULATION: --Intracranial internal carotid arteries: Multifocal moderate atherosclerotic irregularity. --Anterior cerebral arteries (ACA): Normal. Both A1 segments are present. Patent anterior communicating artery (a-comm). --Middle cerebral arteries (MCA): Normal. IMPRESSION: 1. Two punctate foci of acute ischemia at the right caudate body. No hemorrhage or mass effect. 2. 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 3. Severe stenosis of the right P1-2 junction. 4. No emergent large vessel occlusion. Electronically Signed   By: Deatra Robinson M.D.   On: 02/02/2019 00:59   Mr Brain Wo Contrast  Result Date: 02/02/2019 CLINICAL DATA:  Vision loss EXAM: MRI HEAD WITHOUT CONTRAST MRA HEAD WITHOUT CONTRAST TECHNIQUE: Multiplanar, multiecho pulse sequences of the brain and surrounding structures were obtained without intravenous contrast. Angiographic images of the head were obtained using MRA technique without contrast. COMPARISON:  None. FINDINGS: MRI HEAD FINDINGS BRAIN: There are 2 punctate foci of abnormal diffusion restriction at the right caudate body. Multifocal white matter hyperintensity, most commonly due to chronic ischemic microangiopathy. There is an old right frontal subcortical infarct. The cerebral and cerebellar volume are age-appropriate. There is no hydrocephalus. The midline structures are normal. VASCULAR: The major intracranial arterial and venous sinus flow voids are normal. Susceptibility-sensitive sequences show no chronic microhemorrhage or superficial siderosis. SKULL AND UPPER CERVICAL SPINE: Calvarial bone marrow signal is normal. There is no skull base mass. The visualized upper cervical spine and soft tissues are normal. SINUSES/ORBITS: There are no fluid levels or  advanced mucosal thickening. The mastoid air cells and middle ear cavities are free of fluid. The orbits are normal. MRA HEAD FINDINGS POSTERIOR CIRCULATION: --Vertebral arteries: Normal right V4 segment. Diminutive left. --Posterior inferior cerebellar arteries (PICA): Patent origins from the vertebral arteries. --Anterior inferior cerebellar arteries (AICA): Patent origins from the basilar artery. --Basilar artery: Normal. --Superior cerebellar arteries: Normal. --Posterior cerebral arteries: Bilateral diminutive posterior communicating arteries. Severe stenosis of the right P1-2 junction. Otherwise normal. ANTERIOR CIRCULATION: --Intracranial internal carotid arteries: Multifocal moderate atherosclerotic irregularity. --Anterior cerebral arteries (ACA): Normal. Both A1 segments are present. Patent anterior communicating artery (a-comm). --Middle cerebral arteries (MCA): Normal. IMPRESSION: 1. Two punctate foci of acute ischemia at the right caudate body. No hemorrhage  or mass effect. 2. 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 3. Severe stenosis of the right P1-2 junction. 4. No emergent large vessel occlusion. Electronically Signed   By: Ulyses Jarred M.D.   On: 02/02/2019 00:59    EKG: Independently reviewed.  It shows normal sinus rhythm with a rate of 82, no significant ST changes  Assessment/Plan Principal Problem:   Acute cerebrovascular accident (CVA) ([facility]) Active Problems:   HYPERCHOLESTEROLEMIA   Anxiety state   TOBACCO ABUSE   Coronary atherosclerosis   COPD (chronic obstructive pulmonary disease) (HCC)   ALCOHOL ABUSE, HX OF     #1 acute CVA affecting right caudate nucleus: Appears to be consistent with patient's visual loss in the right eye.  Patient already on Plavix and statin.  Will admit and complete CVA work-up.  Will get echocardiogram.  Carotid Dopplers.  Patient will have PT OT and ST evaluation.  Follow neurology recommendation  #2  hyperlipidemia: Continue Lipitor at 80 mg  #3 hypertension: Probably permissive hypertension.  Monitor blood pressure and follow neurology recommendations  #4 tobacco abuse: We will add nicotine patch with tobacco cessation counseling  #5 COPD: No exacerbation  #6 coronary artery disease: No evidence of decompensation.  Continue home regimen  #7 history of alcohol abuse: No active drinking per patient.  Monitor him closely    DVT prophylaxis: Lovenox Code Status: Full code Family Communication: No family at bedside Disposition Plan: Home Consults called: Dr. Cheral Marker of neurology, Dr. Manuella Ghazi ophthalmology Admission status: Inpatient  Severity of Illness: The appropriate patient status for this patient is INPATIENT. Inpatient status is judged to be reasonable and necessary in order to provide the required intensity of service to ensure the patient's safety. The patient's presenting symptoms, physical exam findings, and initial radiographic and laboratory data in the context of their chronic comorbidities is felt to place them at high risk for further clinical deterioration. Furthermore, it is not anticipated that the patient will be medically stable for discharge from the hospital within 2 midnights of admission. The following factors support the patient status of inpatient.   " The patient's presenting symptoms include visual loss. " The worrisome physical exam findings include loss of vision in the lateral to the inferior part of the right eye visual field. " The initial radiographic and laboratory data are worrisome because of MRI positive for CVA. " The chronic co-morbidities include hypertension and CVA.   * I certify that at the point of admission it is my clinical judgment that the patient will require inpatient hospital care spanning beyond 2 midnights from the point of admission due to high intensity of service, high risk for further deterioration and high frequency of surveillance  required.Barbette Merino MD [facility] Pager [phone]  If 7PM-7AM, please contact night-coverage [email or web address] Password [email or web address]  02/02/2019, 4:28 AM

## 2019-02-02 NOTE — Progress Notes (Signed)
  Echocardiogram 2D Echocardiogram has been performed.  [name] 02/02/2019, 4:22 PM

## 2019-02-02 NOTE — Progress Notes (Addendum)
Physical Therapy Treatment Patient Details Name: [name] MRN: [id number] DOB: 01-08-1956 Today's Date: 02/02/2019    History of Present Illness [name] is a 63 y.o. male with medical history significant of coronary artery disease, status post stents to RCA in June 2010, ischemic cardiomyopathy EF of 45 to 50%, hyperlipidemia, hypertension, tobacco abuse, history of alcohol abuse, COPD who presents to the ER with sudden loss of vision in the right eye. MRI showing R caudate nucleus stroke.     PT Comments    Pt admitted with above. Pt displays right visual field cut, with slightly preserved peripheral vision. Functionally, he is compensating for it well. Ambulating hallway and negotiating obstacles without difficulty. He is able to locate rooms/signage on the right side of the hall. Educated on driving restrictions and recommended follow up with ophthalmology and OT consult for further visual field assessment. No PT follow up needs. PT signing off.     Follow Up Recommendations  No PT follow up     Equipment Recommendations  None recommended by PT    Recommendations for Other Services OT consult     Precautions / Restrictions Precautions Precautions: Other (comment) Precaution Comments: R visual field cut Restrictions Weight Bearing Restrictions: No    Mobility  Bed Mobility Overal bed mobility: Independent                Transfers Overall transfer level: Independent Equipment used: None                Ambulation/Gait Ambulation/Gait assistance: Modified independent (Device/Increase time) Gait Distance (Feet): 300 Feet Assistive device: None Gait Pattern/deviations: Step-through pattern     General Gait Details: slower speed, no gross imbalance   Stairs             Wheelchair Mobility    Modified Rankin (Stroke Patients Only) Modified Rankin (Stroke Patients Only) Pre-Morbid Rankin Score: No symptoms Modified Rankin: No  significant disability     Balance Overall balance assessment: Mild deficits observed, not formally tested                                          Cognition Arousal/Alertness: Awake/alert Behavior During Therapy: WFL for tasks assessed/performed Overall Cognitive Status: Within Functional Limits for tasks assessed                                        Exercises      General Comments        Pertinent Vitals/Pain Pain Assessment: No/denies pain    Home Living Family/patient expects to be discharged to:: Private residence Living Arrangements: Parent(mother) Available Help at Discharge: Other (Comment)(girlfriend) Type of Home: House Home Access: Ramped entrance   Home Layout: One level        Prior Function Level of Independence: Independent      Comments: Primary caregiver 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   PT Goals (current goals can now be found in the care plan section) Acute Rehab PT Goals Patient Stated Goal: "take care of my mom." PT Goal Formulation: All assessment and education complete, DC therapy    Frequency           PT Plan      Co-evaluation  AM-PAC PT "6 Clicks" Mobility   Outcome Measure  Help needed turning from your back to your side while in a flat bed without using bedrails?: None Help needed moving from lying on your back to sitting on the side of a flat bed without using bedrails?: None Help needed moving to and from a bed to a chair (including a wheelchair)?: None Help needed standing up from a chair using your arms (e.g., wheelchair or bedside chair)?: None Help needed to walk in hospital room?: None Help needed climbing 3-5 steps with a railing? : None 6 Click Score: 24    End of Session   Activity Tolerance: Patient tolerated treatment well Patient left: in bed;with call bell/phone within reach Nurse Communication: Mobility status PT Visit Diagnosis: Other symptoms and signs involving  the nervous system (R29.898)     Time: 6301-6010 PT Time Calculation (min) (ACUTE ONLY): 13 min  Charges:                        Laurina Bustle, PT, DPT Acute Rehabilitation Services Pager [phone] Office [phone]    [name] 02/02/2019, 1:10 PM

## 2019-02-02 NOTE — Progress Notes (Signed)
Patient is seen and examined. pls see today's H&P for the details. 63 y.o. male with medical history significant of coronary artery disease, status post stents to RCA in June 2010, ischemic cardiomyopathy EF of 45 to 50%, hyperlipidemia, hypertension, tobacco abuse, history of alcohol abuse, COPD who presents to the ER with sudden loss of vision in the right eye that started about 630 last evening and amitted with acute stroke. Work up is in progress. Neuro exam is non focal except vision deficit. Cont current regimen. F/u neurology recommendations. Allow mild permissive hypertension. Close monitor  Tima Curet N

## 2019-02-02 NOTE — ED Notes (Signed)
[facility] calling getting ready to come for pt

## 2019-02-02 NOTE — Consult Note (Addendum)
NEURO HOSPITALIST CONSULT NOTE   Requestig physician: Dr. York Spaniel  Reason for Consult: Right CRAO  History obtained from:  Patient and Chart     HPI:                                                                                                                                          [name] is an 63 y.o. male with CAD, ischemic cardiomyopathy, HTN, HLD, tobacco abuse COPD, anxiety, depression, hypercholesterolemia and PVD who presented to the ED late Saturday evening with sudden onset of vision loss involving almost all of the visual fields of his right eye, except for a thin crescent of preserved temporal vision. He stated that he could only see black when looking straight forward. He denies any other neurological deficits including no limb weakness or numbness and no headache. Also without any systemic complaints including no SOB or chest pain.   The blurred vision started at 1830 on Saturday night. DDx per EDP was CRAO versus CRVO after a phone consult with Ophthalmology.   The patient is prescribed ASA and Plavix at home but has not taken his Plavix in 3 weeks due to difficulty keeping track of prescription refills.   Past Medical History:  Diagnosis Date   ANXIETY    CARPAL TUNNEL SYNDROME, RIGHT    COPD    Coronary artery disease    s/p bare metal stens X 2 to the RCA, 09/28/2008 EF45-50%.  ath 01/2011 with patent RCA stents and moderate disese elsewhere   DEGENERATIVE DISC DISEASE, CERVICAL SPINE, W/RADICULOPATHY    DEPRESSION    GANGLION CYST, WRIST, LEFT    HYPERCHOLESTEROLEMIA    HYPERGLYCEMIA    Hypertension    LUNG NODULES    MI    PERIPHERAL VASCULAR DISEASE WITH CLAUDICATION, LEFT LEG    PLANTAR FASCIITIS, LEFT    REACTIVE AIRWAY DISEASE    ROTATOR CUFF SYNDROME, LEFT    TESTICULAR HYPOFUNCTION    THYROMEGALY     Past Surgical History:  Procedure Laterality Date   ANGIOPLASTY / STENTING FEMORAL     CARDIAC  CATHETERIZATION     01/2011 cardiac cath with patent stents and normal EF    Family History  Problem Relation Age of Onset   Heart failure Father    Diabetes Father               Social History:  reports that he has quit smoking. He has never used smokeless tobacco. He reports that he does not drink alcohol or use drugs.  Allergies  Allergen Reactions   Nsaids Other (See Comments)    REACTION: Not a true allergy--GI upset with some.    HOME MEDICATIONS:  ROS:                                                                                                                                       As per HPI. Comprehensive ROS otherwise negative.    Blood pressure 130/75, pulse 62, temperature 97.8 F (36.6 C), temperature source Oral, resp. rate 12, SpO2 96 %.   General Examination:                                                                                                       Physical Exam  HEENT-  [place]/AT   Lungs- Respirations unlabored Extremities- No edema  Neurological Examination Mental Status: Alert, oriented, thought content appropriate.  Speech fluent without evidence of aphasia.  Able to follow all commands without difficulty. Cranial Nerves: II: Visual fields intact OS. Absent vision OD except for thin crescent in temporal fields.   III,IV, VI: Mild right ptosis. EOMI. No nystagmus.  V,VII: No facial droop. Temp sensation equal bilaterally  VIII: hearing intact to voice IX,X: Palate rises symmetrically XI: Symmetric XII: midline tongue extension Motor: Right : Upper extremity   5/5    Left:     Upper extremity   5/5  Lower extremity   5/5     Lower extremity   5/5 Normal tone throughout; no atrophy noted No pronator drift  Sensory: Temp and light touch intact throughout, bilaterally. No extinction.  Deep Tendon Reflexes: 2+ and  symmetric throughout Cerebellar: No ataxia with FNF bilaterally  Gait: Deferred   Lab Results: Basic Metabolic Panel: Recent Labs  Lab 02/01/19 2250  NA 139  K 4.0  CL 105  CO2 25  GLUCOSE 85  BUN 11  CREATININE 0.68  CALCIUM 9.0    CBC: Recent Labs  Lab 02/01/19 2250  WBC 4.4  NEUTROABS 3.0  HGB 11.5*  HCT 34.8*  MCV 90.9  PLT 221    Cardiac Enzymes: No results for input(s): CKTOTAL, CKMB, CKMBINDEX, TROPONINI in the last 168 hours.  Lipid Panel: No results for input(s): CHOL, TRIG, HDL, CHOLHDL, VLDL, LDLCALC in the last 168 hours.  Imaging: Mr Angio Head Wo Contrast  Result Date: 02/02/2019 CLINICAL DATA:  Vision loss EXAM: MRI HEAD WITHOUT CONTRAST MRA HEAD WITHOUT CONTRAST TECHNIQUE: Multiplanar, multiecho pulse sequences of the brain and surrounding structures were obtained without intravenous contrast. Angiographic images of the head were obtained using MRA technique without contrast. COMPARISON:  None. FINDINGS: MRI HEAD FINDINGS BRAIN: There are 2  punctate foci of abnormal diffusion restriction at the right caudate body. Multifocal white matter hyperintensity, most commonly due to chronic ischemic microangiopathy. There is an old right frontal subcortical infarct. The cerebral and cerebellar volume are age-appropriate. There is no hydrocephalus. The midline structures are normal. VASCULAR: The major intracranial arterial and venous sinus flow voids are normal. Susceptibility-sensitive sequences show no chronic microhemorrhage or superficial siderosis. SKULL AND UPPER CERVICAL SPINE: Calvarial bone marrow signal is normal. There is no skull base mass. The visualized upper cervical spine and soft tissues are normal. SINUSES/ORBITS: There are no fluid levels or advanced mucosal thickening. The mastoid air cells and middle ear cavities are free of fluid. The orbits are normal. MRA HEAD FINDINGS POSTERIOR CIRCULATION: --Vertebral arteries: Normal right V4 segment.  Diminutive left. --Posterior inferior cerebellar arteries (PICA): Patent origins from the vertebral arteries. --Anterior inferior cerebellar arteries (AICA): Patent origins from the basilar artery. --Basilar artery: Normal. --Superior cerebellar arteries: Normal. --Posterior cerebral arteries: Bilateral diminutive posterior communicating arteries. Severe stenosis of the right P1-2 junction. Otherwise normal. ANTERIOR CIRCULATION: --Intracranial internal carotid arteries: Multifocal moderate atherosclerotic irregularity. --Anterior cerebral arteries (ACA): Normal. Both A1 segments are present. Patent anterior communicating artery (a-comm). --Middle cerebral arteries (MCA): Normal. IMPRESSION: 1. Two punctate foci of acute ischemia at the right caudate body. No hemorrhage or mass effect. 2. 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 3. Severe stenosis of the right P1-2 junction. 4. No emergent large vessel occlusion. Electronically Signed   By: Deatra RobinsonKevin  Herman M.D.   On: 02/02/2019 00:59   Mr Brain Wo Contrast  Result Date: 02/02/2019 CLINICAL DATA:  Vision loss EXAM: MRI HEAD WITHOUT CONTRAST MRA HEAD WITHOUT CONTRAST TECHNIQUE: Multiplanar, multiecho pulse sequences of the brain and surrounding structures were obtained without intravenous contrast. Angiographic images of the head were obtained using MRA technique without contrast. COMPARISON:  None. FINDINGS: MRI HEAD FINDINGS BRAIN: There are 2 punctate foci of abnormal diffusion restriction at the right caudate body. Multifocal white matter hyperintensity, most commonly due to chronic ischemic microangiopathy. There is an old right frontal subcortical infarct. The cerebral and cerebellar volume are age-appropriate. There is no hydrocephalus. The midline structures are normal. VASCULAR: The major intracranial arterial and venous sinus flow voids are normal. Susceptibility-sensitive sequences show no chronic microhemorrhage or  superficial siderosis. SKULL AND UPPER CERVICAL SPINE: Calvarial bone marrow signal is normal. There is no skull base mass. The visualized upper cervical spine and soft tissues are normal. SINUSES/ORBITS: There are no fluid levels or advanced mucosal thickening. The mastoid air cells and middle ear cavities are free of fluid. The orbits are normal. MRA HEAD FINDINGS POSTERIOR CIRCULATION: --Vertebral arteries: Normal right V4 segment. Diminutive left. --Posterior inferior cerebellar arteries (PICA): Patent origins from the vertebral arteries. --Anterior inferior cerebellar arteries (AICA): Patent origins from the basilar artery. --Basilar artery: Normal. --Superior cerebellar arteries: Normal. --Posterior cerebral arteries: Bilateral diminutive posterior communicating arteries. Severe stenosis of the right P1-2 junction. Otherwise normal. ANTERIOR CIRCULATION: --Intracranial internal carotid arteries: Multifocal moderate atherosclerotic irregularity. --Anterior cerebral arteries (ACA): Normal. Both A1 segments are present. Patent anterior communicating artery (a-comm). --Middle cerebral arteries (MCA): Normal. IMPRESSION: 1. Two punctate foci of acute ischemia at the right caudate body. No hemorrhage or mass effect. 2. 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 3. Severe stenosis of the right P1-2 junction. 4. No emergent large vessel occlusion. Electronically Signed   By: Deatra RobinsonKevin  Herman M.D.   On: 02/02/2019 00:59  Assessment: 63 year old male with CRAO 1. MRI brain official report documents punctate strokes in the right caudate body. Images personally reviewed and the foci appear most consistent with incidental signal heterogeneity as there is no corresponding hypointensity on the ADC map, nor is there any corresponding hyperintensity on the T2-weighted images.  2. Although there does not appear to be an acute stroke on MRI, the acute CRAO, which may be due to cardioembolic  phenomenon or unstable ICA plaque, as well as the patient's stroke risk factors, necessitate an inpatient stroke work up.  3. Not classifiable as having failed DAPT as he has missed his Plavix doses for 3 weeks.   Recommendations: 1. HgbA1c, fasting lipid panel 2. MRA of the brain without contrast 3. PT consult, OT consult, Speech consult 4. Echocardiogram and carotid ultrasound 5. Continue atorvastatin. 6. Continue ASA. Plavix has been restarted 7. Risk factor modification 8. Telemetry monitoring 9. Frequent neuro checks 10 NPO until passes stroke swallow screen 11. BP management 12. Consult Ophthalmology for inpatient dilated retinal exam in order to assess if the vision loss is due to CRAO or CRVO 13. Please page stroke NP  Or  PA  Or MD from 8am -4 pm  as this patient from this time will be  followed by the stroke.   You can look them up on [email or web address]  Password [email or web address]    Electronically signed: Dr. Kerney Elbe 02/02/2019, 2:31 AM

## 2019-02-02 NOTE — Progress Notes (Signed)
STROKE TEAM PROGRESS NOTE   HISTORY OF PRESENT ILLNESS (per record) [name] is an 63 y.o. male with CAD, ischemic cardiomyopathy, HTN, HLD, tobacco abuse COPD, anxiety, depression, hypercholesterolemia and PVD who presented to the ED late Saturday evening with sudden onset of vision loss involving almost all of the visual fields of his right eye, except for a thin crescent of preserved temporal vision. He stated that he could only see black when looking straight forward. He denies any other neurological deficits including no limb weakness or numbness and no headache. Also without any systemic complaints including no SOB or chest pain.   The blurred vision started at 1830 on Saturday night. DDx per EDP was CRAO versus CRVO after a phone consult with Ophthalmology.   The patient is prescribed ASA and Plavix at home but has not taken his Plavix in 3 weeks due to difficulty keeping track of prescription refills.    INTERVAL HISTORY Patient states he still has significant vision loss in the right eye which has persisted.  He is only able to see from a temporal field crescent in the right eye.  He denies symptoms of slurred speech, facial droop or left-sided weakness but MRI that showed tiny right caudate lacunar infarcts which are clinically silent.    OBJECTIVE Vitals:   02/02/19 0545 02/02/19 0600 02/02/19 0622 02/02/19 0800  BP: (!) 146/88 133/67  133/68  Pulse: 62 (!) 58  72  Resp:   (!) 22 18  Temp:   97.8 F (36.6 C) 98.6 F (37 C)  TempSrc:   Oral Oral  SpO2: 95% 95%  97%    CBC:  Recent Labs  Lab 02/01/19 2250 02/02/19 0805  WBC 4.4 4.5  NEUTROABS 3.0  --   HGB 11.5* 11.9*  HCT 34.8* 35.3*  MCV 90.9 88.9  PLT 221 214    Basic Metabolic Panel:  Recent Labs  Lab 02/01/19 2250 02/02/19 0805  NA 139  --   K 4.0  --   CL 105  --   CO2 25  --   GLUCOSE 85  --   BUN 11  --   CREATININE 0.68 0.71  CALCIUM 9.0  --     Lipid Panel:     Component Value  Date/Time   CHOL 130 02/02/2019 0805   CHOL 127 11/05/2018 1504   TRIG 48 02/02/2019 0805   HDL 62 02/02/2019 0805   HDL 61 11/05/2018 1504   CHOLHDL 2.1 02/02/2019 0805   VLDL 10 02/02/2019 0805   LDLCALC 58 02/02/2019 0805   LDLCALC 55 11/05/2018 1504   HgbA1c:  Lab Results  Component Value Date   HGBA1C 5.7 (H) 02/02/2019   Urine Drug Screen:     Component Value Date/Time   LABOPIA POSITIVE (A) 10/14/2006 1702   COCAINSCRNUR NONE DETECTED 10/14/2006 1702   LABBENZ NONE DETECTED 10/14/2006 1702   AMPHETMU NONE DETECTED 10/14/2006 1702   THCU NONE DETECTED 10/14/2006 1702   LABBARB  10/14/2006 1702    NONE DETECTED        DRUG SCREEN FOR MEDICAL PURPOSES ONLY.  IF CONFIRMATION IS NEEDED FOR ANY PURPOSE, NOTIFY LAB WITHIN 5 DAYS.    Alcohol Level [facility] found [facility]: [facility]  [facility]  Mr Brain 107Wo Contrast Mr Angio Head Wo Contrast 02/02/2019 IMPRESSION:  1. Two punctate foci of acute ischemia at the right caudate body. No hemorrhage or mass effect.  2. Old right frontal subcortical infarct and mild chronic small vessel  disease.  3. Severe stenosis of the right P1-2 junction.  4. No emergent large vessel occlusion.   Vas US Carotid (at [place] Only) 02/02/2019 Summary:  Right Carotid: Velocities in the right ICA are consistent with a 40-59% stenosis.  Left Carotid: Velocities in the left ICA are consistent with a 1-39% stenosis.  Vertebrals:  Bilateral vertebral arteries demonstrate antegrade flow.  Subclavians: Normal flow hemodynamics were seen in bilateral subclavian arteries.  Preliminary     Transthoracic Echocardiogram  00/00/2020 Pending No results found for this or any previous visit (from the past 43800 hour(s)).   ECG - SR rate   BPM. (See cardiology reading for complete details)   PHYSICAL EXAM Blood pressure 133/68, pulse 72, temperature 98.6 F (37 C), temperature source Oral, resp. rate 18, SpO2 97 %. Pleasant middle-aged Caucasian male  not in distress. . Afebrile. Head is nontraumatic. Neck is supple without bruit.    Cardiac exam no murmur or gallop. Lungs are clear to auscultation. Distal pulses are well felt.  Neurological Exam ;  Awake  Alert oriented x 3. Normal speech and language.eye movements full without nystagmus.fundi were not visualized.  Right pupil is sluggishly reactive with afferent pupillary defect.  Left pupil reacts normally vision acuity significantly decreased in the right eye and only able to see through a temporal crescent in the right eye.  Vision acuity is normal in the left eye.  Fields appear normal in the left eye.Marland Kitchen Hearing is normal. Palatal movements are normal. Face symmetric. Tongue midline. Normal strength, tone, reflexes and coordination. Normal sensation. Gait deferred.     ASSESSMENT/PLAN [name] is a 63 y.o. male with history of CAD (stents), ischemic cardiomyopathy, HTN, HLD, tobacco abuse, COPD, anxiety, depression, hx of thyromegaly, hx of lung nodules, hypercholesterolemia and PVD who presented to the ED late Saturday evening with sudden onset of vision loss involving almost all of the visual fields of his right eye, except for a thin crescent of preserved temporal vision.CRAO versus CRVO. He did not receive IV t-PA due to minor deficits - risk outweighed benefits.  Stroke:  Two punctate foci of acute ischemia at the right caudate body.  Which are likely clinically silent.  Resultant right eye near complete vision loss probably secondary to proximal moderate right carotid stenosis and plaque  Code Stroke CT Head - not ordered  CT head - not ordered  MRI head - Two punctate foci of acute ischemia at the right caudate body. No hemorrhage or mass effect. 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   MRA head - Severe stenosis of the right P1-2 junction.  CTA H&N - not ordered  CT Perfusion - not ordered  Carotid Doppler - right ICA  40-59% stenosis.   2D Echo - pending  Lacey Jensen Virus 2 - negative  LDL - 58  HgbA1c - 5.7  UDS - consider ordering  VTE prophylaxis - Lovenox Diet  Diet Order            Diet heart healthy/carb modified Room service appropriate? No; Fluid consistency: Thin  Diet effective now              aspirin 81 mg daily (had been off his Plavix) prior to admission, now on aspirin 81 mg daily and clopidogrel 75 mg daily  Patient counseled to be compliant with his antithrombotic medications  Ongoing aggressive stroke risk factor management  Therapy recommendations:  pending  Disposition:  Pending  Hypertension  Home BP meds: metoprolol  Current BP meds: metoprolol  Stable . Permissive hypertension (OK if < 220/120) but gradually normalize in 5-7 days . Long-term BP goal normotensive  Hyperlipidemia  Home Lipid lowering medication: Lipitor 80 mg daily  LDL 58, goal < 70  Current lipid lowering medication: Lipitor 80 mg daily  Continue statin at discharge  Other Stroke Risk Factors  Advanced age  Former cigarette smoker - quit  Hx stroke/TIA (by imaging)  Coronary artery disease  Cardiomyopathy - echo pending  Medication compliance issues - (was supposed to be on Plavix)   Other Active Problems  Mild anemia   Hospital day # 0 I have personally obtained history,examined this patient, reviewed notes, independently viewed imaging studies, participated in medical decision making and plan of care.ROS completed by me personally and pertinent positives fully documented  I have made any additions or clarifications directly to the above note.  He presented with sudden onset of painless near complete vision loss in the right eye likely due to right central retinal artery occlusion possibly embolic from moderate proximal right carotid stenosis.  Recommend vascular surgery consult for probable right carotid revascularization.  Aspirin and Plavix long-term given prior  history of cardiac stents.  Aggressive risk factor modification.  Discussed with patient and Dr.Ulugbek  Greater than 50% time during this 35-minute visit was spent on counseling and coordination of care about his vision loss and carotid stenosis and answering questions Delia Heady, MD Medical Director [facility] Pager: [phone] 02/02/2019 2:10 PM   To contact Stroke Continuity provider, please refer to [email or web address]. After hours, contact General Neurology

## 2019-02-03 DIAGNOSIS — I6521 Occlusion and stenosis of right carotid artery: Secondary | ICD-10-CM | POA: Diagnosis present

## 2019-02-03 DIAGNOSIS — I639 Cerebral infarction, unspecified: Secondary | ICD-10-CM

## 2019-02-03 DIAGNOSIS — H5461 Unqualified visual loss, right eye, normal vision left eye: Secondary | ICD-10-CM | POA: Diagnosis present

## 2019-02-03 NOTE — Discharge Summary (Signed)
Physician Discharge Summary  Hubert Raatz [place] WGN:[id number] DOB: [birth year] DOA: 02/01/2019  PCP: Charlies Silvers, PA-C  Admit date: 02/01/2019 Discharge date: 02/03/2019  Admitted From: Home Disposition: Home  Recommendations for Outpatient Follow-up:  1. Follow up with PCP in 1-2 weeks 2. Follow-up with ophthalmology in 1 to 2 weeks 3. Follow-up neurology in 4 to 6 weeks 4. Follow-up with vascular surgery, Dr. Chestine Spore in 1 to 2 weeks 5. Follow with outpatient OT 6. Please obtain BMP/CBC in one week 7. Please follow up on the following pending results:  Home Health: None Equipment/Devices: None  Discharge Condition: Stable CODE STATUS: Full code Diet recommendation: Cardiac  Subjective: Seen and examined.  No complaints but continues to have right-sided vision loss.  Has some intact right peripheral vision.  Brief/Interim Summary: [name] is a 63 y.o. male with medical history significant of coronary artery disease, status post stents to RCA in June 2010, ischemic cardiomyopathy EF of 45 to 50%, hyperlipidemia, hypertension, tobacco abuse, history of alcohol abuse, COPD who presented to the ER with sudden loss of vision in the right eye that started about 630 last evening.  It started more in the central region then turned to more black to the central and then inferior part. He was worried and came to the ER. He was initially seen by ophthalmology Dr. Georges Mouse who did ocular message and recommended MRI.  Upon arrival to ER, patient was hemodynamically stable with no focal neurological deficit at that time vision changes.MRI of the brain done in the emergency department showed 2 punctate foci of acute ischemia at the right carotid body no hemorrhage or mass-effect.  There is old right frontal subcortical infarct and mild chronic small vessel disease.  There is severe stenosis of the right P1 P2 junction. he has been admitted with acute stroke under [facility] and neurology  consulted.  MR angiogram of head without contrast ruled out any thrombosis or severe stenosis.  Carotid ultrasound showed 40 to 60% stenosis in the right internal carotid artery and less than 39% in the left.  His aspirin and Plavix as well as atorvastatin were continued.  He was evaluated by PT OT who recommended outpatient OT.  Neurology recommended vascular surgery consult.  I spoke to Dr. Chestine Spore today, however he had several consults today so he was not able to see patient urgently and patient is medically stable for discharge and has been cleared by neurology.  I then spoke to Dr. Pearlean Brownie from neurology personally who stated that patient can see vascular surgery as an outpatient since this is not urgent.  I then called Dr. Chestine Spore back and requested him to make sure the patient gets follow-up appointment with him as soon as possible.  No other changes.  Patient is hemodynamically stable.  Patient also needs to see ophthalmology in 1 to 2 weeks.  Discharge Diagnoses:  Principal Problem:   Acute cerebrovascular accident (CVA) (HCC) Active Problems:   HYPERCHOLESTEROLEMIA   Anxiety state   TOBACCO ABUSE   Coronary atherosclerosis   COPD (chronic obstructive pulmonary disease) (HCC)   ALCOHOL ABUSE, HX OF   Carotid stenosis, right   Vision loss of right eye    Discharge Instructions  Discharge Instructions    Ambulatory referral to Occupational Therapy   Complete by: As directed    Discharge patient   Complete by: As directed    Discharge disposition: 01-Home or Self Care   Discharge patient date: 02/03/2019     Allergies as  of 02/03/2019      Reactions   Nsaids Other (See Comments)   REACTION: Not a true allergy--GI upset with some.      Medication List    TAKE these medications   amphetamine-dextroamphetamine 15 MG tablet Commonly known as: ADDERALL Take 15 mg by mouth daily.   aspirin 81 MG tablet Take 1 tablet (81 mg total) by mouth every morning. What changed: when to take  this   atorvastatin 80 MG tablet Commonly known as: LIPITOR TAKE 1 TABLET(80 MG) BY MOUTH DAILY What changed:   how much to take  how to take this  when to take this  additional instructions   cholecalciferol 1000 units tablet Commonly known as: VITAMIN D Take 1,000 Units by mouth every evening.   clopidogrel 75 MG tablet Commonly known as: PLAVIX TAKE 1 TABLET(75 MG) BY MOUTH DAILY What changed: See the new instructions.   famotidine 10 MG tablet Commonly known as: PEPCID Take 10 mg by mouth as needed for heartburn or indigestion.   gabapentin 600 MG tablet Commonly known as: NEURONTIN Take 600 mg by mouth 3 (three) times daily.   isosorbide mononitrate 30 MG 24 hr tablet Commonly known as: IMDUR TAKE 1 TABLET BY MOUTH EVERY DAY   metoprolol tartrate 50 MG tablet Commonly known as: LOPRESSOR TAKE 1/2 TABLET(25 MG) BY MOUTH TWICE DAILY What changed: See the new instructions.   nitroGLYCERIN 0.6 MG SL tablet Commonly known as: NITROSTAT PLACE 1 TABLET UNDER THE TONGUE EVERY 5 MINUTES AS NEEDED FOR CHEST PAIN What changed:   how much to take  how to take this  when to take this  reasons to take this  additional instructions   Suboxone 8-2 MG Film Generic drug: Buprenorphine HCl-Naloxone HCl Place 0.5-1 application under the tongue daily. 1 in the morning and 1/2 in the afternoon   VITAMIN B-COMPLEX PO Take 1 tablet by mouth daily.   vitamin C 1000 MG tablet Take 1,000 mg by mouth at bedtime.      Follow-up Information    Couillard, Victorino Dike, PA-C Follow up in 1 week(s).   Specialty: Physician Assistant Contact information: [address] [place] [address] [phone]        Micki Riley, MD Follow up in 6 week(s).   Specialties: Neurology, Radiology Contact information: [address] [place] [address] [phone]        Cephus Shelling, MD Follow up in 1 week(s).   Specialty: Vascular  Surgery Contact information: [address] [place] [address] [phone]          Allergies  Allergen Reactions  . Nsaids Other (See Comments)    REACTION: Not a true allergy--GI upset with some.    Consultations: Neurology   Procedures/Studies: Mr Angio Head Wo Contrast  Result Date: 02/02/2019 CLINICAL DATA:  Vision loss EXAM: MRI HEAD WITHOUT CONTRAST MRA HEAD WITHOUT CONTRAST TECHNIQUE: Multiplanar, multiecho pulse sequences of the brain and surrounding structures were obtained without intravenous contrast. Angiographic images of the head were obtained using MRA technique without contrast. COMPARISON:  None. FINDINGS: MRI HEAD FINDINGS BRAIN: There are 2 punctate foci of abnormal diffusion restriction at the right caudate body. Multifocal white matter hyperintensity, most commonly due to chronic ischemic microangiopathy. There is an old right frontal subcortical infarct. The cerebral and cerebellar volume are age-appropriate. There is no hydrocephalus. The midline structures are normal. VASCULAR: The major intracranial arterial and venous sinus flow voids are normal. Susceptibility-sensitive sequences show no  chronic microhemorrhage or superficial siderosis. SKULL AND UPPER CERVICAL SPINE: Calvarial bone marrow signal is normal. There is no skull base mass. The visualized upper cervical spine and soft tissues are normal. SINUSES/ORBITS: There are no fluid levels or advanced mucosal thickening. The mastoid air cells and middle ear cavities are free of fluid. The orbits are normal. MRA HEAD FINDINGS POSTERIOR CIRCULATION: --Vertebral arteries: Normal right V4 segment. Diminutive left. --Posterior inferior cerebellar arteries (PICA): Patent origins from the vertebral arteries. --Anterior inferior cerebellar arteries (AICA): Patent origins from the basilar artery. --Basilar artery: Normal. --Superior cerebellar arteries: Normal. --Posterior cerebral arteries: Bilateral diminutive posterior  communicating arteries. Severe stenosis of the right P1-2 junction. Otherwise normal. ANTERIOR CIRCULATION: --Intracranial internal carotid arteries: Multifocal moderate atherosclerotic irregularity. --Anterior cerebral arteries (ACA): Normal. Both A1 segments are present. Patent anterior communicating artery (a-comm). --Middle cerebral arteries (MCA): Normal. IMPRESSION: 1. Two punctate foci of acute ischemia at the right caudate body. No hemorrhage or mass effect. 2. 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 3. Severe stenosis of the right P1-2 junction. 4. No emergent large vessel occlusion. Electronically Signed   By: Ulyses Jarred M.D.   On: 02/02/2019 00:59   Mr Brain Wo Contrast  Result Date: 02/02/2019 CLINICAL DATA:  Vision loss EXAM: MRI HEAD WITHOUT CONTRAST MRA HEAD WITHOUT CONTRAST TECHNIQUE: Multiplanar, multiecho pulse sequences of the brain and surrounding structures were obtained without intravenous contrast. Angiographic images of the head were obtained using MRA technique without contrast. COMPARISON:  None. FINDINGS: MRI HEAD FINDINGS BRAIN: There are 2 punctate foci of abnormal diffusion restriction at the right caudate body. Multifocal white matter hyperintensity, most commonly due to chronic ischemic microangiopathy. There is an old right frontal subcortical infarct. The cerebral and cerebellar volume are age-appropriate. There is no hydrocephalus. The midline structures are normal. VASCULAR: The major intracranial arterial and venous sinus flow voids are normal. Susceptibility-sensitive sequences show no chronic microhemorrhage or superficial siderosis. SKULL AND UPPER CERVICAL SPINE: Calvarial bone marrow signal is normal. There is no skull base mass. The visualized upper cervical spine and soft tissues are normal. SINUSES/ORBITS: There are no fluid levels or advanced mucosal thickening. The mastoid air cells and middle ear cavities are free of fluid.  The orbits are normal. MRA HEAD FINDINGS POSTERIOR CIRCULATION: --Vertebral arteries: Normal right V4 segment. Diminutive left. --Posterior inferior cerebellar arteries (PICA): Patent origins from the vertebral arteries. --Anterior inferior cerebellar arteries (AICA): Patent origins from the basilar artery. --Basilar artery: Normal. --Superior cerebellar arteries: Normal. --Posterior cerebral arteries: Bilateral diminutive posterior communicating arteries. Severe stenosis of the right P1-2 junction. Otherwise normal. ANTERIOR CIRCULATION: --Intracranial internal carotid arteries: Multifocal moderate atherosclerotic irregularity. --Anterior cerebral arteries (ACA): Normal. Both A1 segments are present. Patent anterior communicating artery (a-comm). --Middle cerebral arteries (MCA): Normal. IMPRESSION: 1. Two punctate foci of acute ischemia at the right caudate body. No hemorrhage or mass effect. 2. 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 3. Severe stenosis of the right P1-2 junction. 4. No emergent large vessel occlusion. Electronically Signed   By: Ulyses Jarred M.D.   On: 02/02/2019 00:59   Vas US Carotid (at [place] [place] Only)  Result Date: 02/02/2019 Carotid Arterial Duplex Study Indications:       CVA and Visual disturbance (Right eye vision loss) Risk Factors:      Hypertension, hyperlipidemia, current smoker, coronary artery  disease. Other Factors:     ETOH abuse. COPD. Comparison Study:  No prior study on file for comparison Performing Technologist: Sherren Kerns RVS  Examination Guidelines: A complete evaluation includes B-mode imaging, spectral Doppler, color Doppler, and power Doppler as needed of all accessible portions of each vessel. Bilateral testing is considered an integral part of a complete examination. Limited examinations for reoccurring indications may be performed as noted.  Right Carotid Findings:  +----------+--------+--------+--------+------------------+------------------+           PSV cm/sEDV cm/sStenosisPlaque DescriptionComments           +----------+--------+--------+--------+------------------+------------------+ CCA Prox  82      18                                intimal thickening +----------+--------+--------+--------+------------------+------------------+ CCA Distal88      22                                intimal thickening +----------+--------+--------+--------+------------------+------------------+ ICA Prox  181     47      40-59%  heterogenous      Shadowing          +----------+--------+--------+--------+------------------+------------------+ ICA Mid   129     37                                                   +----------+--------+--------+--------+------------------+------------------+ ICA Distal96      36                                                   +----------+--------+--------+--------+------------------+------------------+ ECA       113     13                                                   +----------+--------+--------+--------+------------------+------------------+ +----------+--------+-------+--------+-------------------+           PSV cm/sEDV cmsDescribeArm Pressure (mmHG) +----------+--------+-------+--------+-------------------+ [id number]                                        +----------+--------+-------+--------+-------------------+ +---------+--------+--+--------+--+ VertebralPSV cm/s68EDV cm/s18 +---------+--------+--+--------+--+  Left Carotid Findings: +----------+--------+--------+--------+------------------+------------------+           PSV cm/sEDV cm/sStenosisPlaque DescriptionComments           +----------+--------+--------+--------+------------------+------------------+ CCA Prox  156     31                                intimal thickening  +----------+--------+--------+--------+------------------+------------------+ CCA Distal91      23                                intimal thickening +----------+--------+--------+--------+------------------+------------------+ ICA Prox  133     36      1-39%  heterogenous                         +----------+--------+--------+--------+------------------+------------------+ ICA Mid   139     47      40-59%  homogeneous                          +----------+--------+--------+--------+------------------+------------------+ ICA Distal130     33                                                   +----------+--------+--------+--------+------------------+------------------+ ECA       179     20                                                   +----------+--------+--------+--------+------------------+------------------+ +----------+--------+--------+--------+-------------------+           PSV cm/sEDV cm/sDescribeArm Pressure (mmHG) +----------+--------+--------+--------+-------------------+ Subclavian101                                         +----------+--------+--------+--------+-------------------+ +---------+--------+---+--------+--+ VertebralPSV cm/s103EDV cm/s20 +---------+--------+---+--------+--+  Summary: Right Carotid: Velocities in the right ICA are consistent with a 40-59%                stenosis. Left Carotid: Velocities in the left ICA are consistent with a 1-39% stenosis. Vertebrals:  Bilateral vertebral arteries demonstrate antegrade flow. Subclavians: Normal flow hemodynamics were seen in bilateral subclavian              arteries. *See table(s) above for measurements and observations.  Electronically signed by Delia Heady MD on 02/02/2019 at 11:29:05 AM.    Final       Discharge Exam: Vitals:   02/03/19 0854 02/03/19 1248  BP: (!) 154/117 (!) 141/64  Pulse: 80 77  Resp: 17 17  Temp: 98.1 F (36.7 C) 98.1 F (36.7 C)  SpO2: 96% 98%    Vitals:   02/03/19 0300 02/03/19 0450 02/03/19 0854 02/03/19 1248  BP:  125/71 (!) 154/117 (!) 141/64  Pulse: 64 62 80 77  Resp:  Temp:  98.2 F (36.8 C) 98.1 F (36.7 C) 98.1 F (36.7 C)  TempSrc:  Oral Oral Oral  SpO2:  95% 96% 98%    General: Pt is alert, awake, not in acute distress Cardiovascular: RRR, S1/S2 +, no rubs, no gallops Respiratory: CTA bilaterally, no wheezing, no rhonchi Abdominal: Soft, NT, ND, bowel sounds + Extremities: no edema, no cyanosis    The results of significant diagnostics from this hospitalization (including imaging, microbiology, ancillary and laboratory) are listed below for reference.     Microbiology: Recent Results (from the past 240 hour(s))  SARS CORONAVIRUS 2 (TAT 6-24 HRS) Nasopharyngeal Nasopharyngeal Swab     Status: None   Collection Time: 02/02/19 12:01 AM   Specimen: Nasopharyngeal Swab  Result Value Ref Range Status   SARS Coronavirus 2 NEGATIVE NEGATIVE Final    Comment: (NOTE) SARS-CoV-2 target nucleic acids are NOT DETECTED. The SARS-CoV-2 RNA is generally detectable in upper and lower respiratory specimens during the acute phase  of infection. Negative results do not preclude SARS-CoV-2 infection, do not rule out co-infections with other pathogens, and should not be used as the sole basis for treatment or other patient management decisions. Negative results must be combined with clinical observations, patient history, and epidemiological information. The expected result is Negative. Fact Sheet for Patients: HairSlick.no Fact Sheet for Healthcare Providers: quierodirigir.com This test is not yet approved or cleared by the [place] FDA and  has been authorized for detection and/or diagnosis of SARS-CoV-2 by FDA under an Emergency Use Authorization (EUA). This EUA will remain  in effect (meaning this test can be used) for the duration of the COVID-19  declaration under Section 56 4(b)(1) of the Act, 21 U.S.C. section 360bbb-3(b)(1), unless the authorization is terminated or revoked sooner. Performed at [facility], [address] [place], [place] [address]      Labs: BNP (last 3 results) No results for input(s): BNP in the last 8760 hours. Basic Metabolic Panel: Recent Labs  Lab 02/01/19 2250 02/02/19 0805  NA 139  --   K 4.0  --   CL 105  --   CO2 25  --   GLUCOSE 85  --   BUN 11  --   CREATININE 0.68 0.71  CALCIUM 9.0  --    Liver Function Tests: Recent Labs  Lab 02/01/19 2250  AST 32  ALT 26  ALKPHOS 68  BILITOT 0.3  PROT 7.0  ALBUMIN 3.4*   No results for input(s): LIPASE, AMYLASE in the last 168 hours. No results for input(s): AMMONIA in the last 168 hours. CBC: Recent Labs  Lab 02/01/19 2250 02/02/19 0805  WBC 4.4 4.5  NEUTROABS 3.0  --   HGB 11.5* 11.9*  HCT 34.8* 35.3*  MCV 90.9 88.9  PLT 221 214   Cardiac Enzymes: No results for input(s): CKTOTAL, CKMB, CKMBINDEX, TROPONINI in the last 168 hours. BNP: Invalid input(s): POCBNP CBG: No results for input(s): GLUCAP in the last 168 hours. D-Dimer No results for input(s): DDIMER in the last 72 hours. Hgb A1c Recent Labs    02/02/19 0805  HGBA1C 5.7*   Lipid Profile Recent Labs    02/02/19 0805  CHOL 130  HDL 62  LDLCALC 58  TRIG 48  CHOLHDL 2.1   Thyroid function studies No results for input(s): TSH, T4TOTAL, T3FREE, THYROIDAB in the last 72 hours.  Invalid input(s): FREET3 Anemia work up No results for input(s): VITAMINB12, FOLATE, FERRITIN, TIBC, IRON, RETICCTPCT in the last 72 hours. Urinalysis    Component Value Date/Time   COLORURINE lt. yellow 04/29/2010 0930   APPEARANCEUR Clear 04/29/2010 0930   LABSPEC <1.005 04/29/2010 0930   PHURINE 5.0 04/29/2010 0930   GLUCOSEU NEGATIVE 04/10/2009 1106   HGBUR trace-intact 04/29/2010 0930   BILIRUBINUR negative 04/29/2010 0930   KETONESUR NEGATIVE 04/10/2009 1106    PROTEINUR NEGATIVE 04/10/2009 1106   UROBILINOGEN 0.2 04/29/2010 0930   NITRITE negative 04/29/2010 0930   LEUKOCYTESUR  04/10/2009 1106    NEGATIVE MICROSCOPIC NOT DONE ON URINES WITH NEGATIVE PROTEIN, BLOOD, LEUKOCYTES, NITRITE, OR GLUCOSE <1000 mg/dL.   Sepsis Labs Invalid input(s): PROCALCITONIN,  WBC,  LACTICIDVEN Microbiology Recent Results (from the past 240 hour(s))  SARS CORONAVIRUS 2 (TAT 6-24 HRS) Nasopharyngeal Nasopharyngeal Swab     Status: None   Collection Time: 02/02/19 12:01 AM   Specimen: Nasopharyngeal Swab  Result Value Ref Range Status   SARS Coronavirus 2 NEGATIVE NEGATIVE Final    Comment: (NOTE) SARS-CoV-2 target  nucleic acids are NOT DETECTED. The SARS-CoV-2 RNA is generally detectable in upper and lower respiratory specimens during the acute phase of infection. Negative results do not preclude SARS-CoV-2 infection, do not rule out co-infections with other pathogens, and should not be used as the sole basis for treatment or other patient management decisions. Negative results must be combined with clinical observations, patient history, and epidemiological information. The expected result is Negative. Fact Sheet for Patients: HairSlick.nohttps://www.fda.gov/media/138098/download Fact Sheet for Healthcare Providers: quierodirigir.comhttps://www.fda.gov/media/138095/download This test is not yet approved or cleared by the [place] FDA and  has been authorized for detection and/or diagnosis of SARS-CoV-2 by FDA under an Emergency Use Authorization (EUA). This EUA will remain  in effect (meaning this test can be used) for the duration of the COVID-19 declaration under Section 56 4(b)(1) of the Act, 21 U.S.C. section 360bbb-3(b)(1), unless the authorization is terminated or revoked sooner. Performed at [facility], [address] [place], [place] [address]      Time coordinating discharge: Over 30 minutes  SIGNED:   Hughie Clossavi Machelle Raybon, MD  [facility] 02/03/2019, 1:22 PM  If 7PM-7AM, please contact night-coverage [email or web address] Password [email or web address]

## 2019-02-03 NOTE — TOC Transition Note (Signed)
Transition of Care [facility]) - CM/SW Discharge Note   Patient Details  Name: [name] MRN: [id number] Date of Birth: January 13, 1956  Transition of Care [facility]) CM/SW Contact:  Pollie Friar, RN Phone Number: 02/03/2019, 2:15 PM   Clinical Narrative:    Pt discharging home with outpatient therapy. CM provided choice and [facility] selected.  Orders in Epic and information on the AVS. Pt has transportation home.   Final next level of care: OP Rehab Barriers to Discharge: No Barriers Identified   Patient Goals and CMS Choice        Discharge Placement                       Discharge Plan and Services   Discharge Planning Services: CM Consult                                 Social Determinants of Health (SDOH) Interventions     Readmission Risk Interventions No flowsheet data found.

## 2019-02-03 NOTE — TOC Initial Note (Signed)
Transition of Care [facility]) - Initial/Assessment Note    Patient Details  Name: [name] MRN: [id number] Date of Birth: 1955/07/28  Transition of [facility]) CM/SW Contact:    Pollie Friar, RN Phone Number: 02/03/2019, 12:32 PM  Clinical Narrative:                 Pt states he assists his mother at home. He has access to DME at home but doesn't currently use any of the DME. Pt denies issues with transportation or with home medications.  TOC following for d/c needs.   Expected Discharge Plan: OP Rehab Barriers to Discharge: Continued Medical Work up   Patient Goals and CMS Choice        Expected Discharge Plan and Services Expected Discharge Plan: OP Rehab   Discharge Planning Services: CM Consult   Living arrangements for the past 2 months: Single Family Home                                      Prior Living Arrangements/Services Living arrangements for the past 2 months: Single Family Home Lives with:: Parents(mother) Patient language and need for interpreter reviewed:: Yes(no needs) Do you feel safe going back to the place where you live?: Yes      Need for Family Participation in Patient Care: No (Comment) Care giver support system in place?: Yes (comment)(pt has family that lives all around him that can provide intermittent supervision) Current home services: DME(has access to cane, walker, shower seat) Criminal Activity/Legal Involvement Pertinent to Current Situation/Hospitalization: No - Comment as needed  Activities of Daily Living      Permission Sought/Granted                  Emotional Assessment Appearance:: Appears stated age Attitude/Demeanor/Rapport: Engaged Affect (typically observed): Accepting, Pleasant Orientation: : Oriented to Self, Oriented to Place, Oriented to  Time, Oriented to Situation   Psych Involvement: No (comment)  Admission diagnosis:  Vision loss, right eye [H54.61] Cerebrovascular accident (CVA),  unspecified mechanism ([place]) [I63.9] Patient Active Problem List   Diagnosis Date Noted  . Acute cerebrovascular accident (CVA) ([place]) 02/02/2019  . TESTICULAR HYPOFUNCTION 04/29/2010  . UPPER RESPIRATORY INFECTION, ACUTE 01/06/2010  . FATIGUE 01/06/2010  . COUGH 01/06/2010  . COPD (chronic obstructive pulmonary disease) ([place]) 10/29/2009  . ERECTILE DYSFUNCTION, ORGANIC 05/18/2009  . LEG CRAMPS 05/18/2009  . ANKLE PAIN, RIGHT 02/26/2009  . MI 09/27/2008  . Coronary atherosclerosis 09/27/2008  . PERIPHERAL VASCULAR DISEASE WITH CLAUDICATION, LEFT LEG 08/28/2008  . DEGENERATIVE DISC DISEASE, LUMBOSACRAL SPINE 08/28/2008  . LEG PAIN, BILATERAL 08/18/2008  . THYROMEGALY 06/26/2008  . GANGLION CYST, WRIST, LEFT 06/26/2008  . HYPERGLYCEMIA 06/26/2008  . HYPERCHOLESTEROLEMIA 06/06/2008  . CARPAL TUNNEL SYNDROME, RIGHT 09/12/2007  . Essential hypertension 09/12/2007  . PAIN IN JOINT PELVIC REGION AND THIGH 05/23/2007  . PLANTAR FASCIITIS, LEFT 05/23/2007  . DEPRESSION 04/07/2007  . REACTIVE AIRWAY DISEASE 01/21/2007  . ABSCESS, TOOTH 01/21/2007  . SKIN RASH 01/21/2007  . Anxiety state 01/05/2007  . TOBACCO ABUSE 01/05/2007  . INSOMNIA 01/05/2007  . ALCOHOL ABUSE, HX OF 01/05/2007  . LIVER FUNCTION TESTS, ABNORMAL, HX OF 01/05/2007  . DEGENERATIVE DISC DISEASE, CERVICAL SPINE, W/RADICULOPATHY 05/04/2006  . ROTATOR CUFF SYNDROME, LEFT 05/04/2006  . LUNG NODULES 01/02/2006   PCP:  Dewayne Shorter, PA-C Pharmacy:   [facility] [phone] - [place], [place] - [phone]  [address] AT [facility] [id number] [place] [address] Phone: [phone] Fax: [phone]     Social Determinants of Health (SDOH) Interventions    Readmission Risk Interventions No flowsheet data found.

## 2019-02-03 NOTE — Progress Notes (Signed)
Occupational Therapy Evaluation Patient Details Name: [name] MRN: [id number] DOB: 1956-03-12 Today's Date: 02/03/2019    History of Present Illness [name] is a 63 y.o. male with medical history significant of coronary artery disease, status post stents to RCA in June 2010, ischemic cardiomyopathy EF of 45 to 50%, hyperlipidemia, hypertension, tobacco abuse, history of alcohol abuse, COPD who presents to the ER with sudden loss of vision in the right eye. MRI showing R caudate nucleus stroke.    Clinical Impression   PTA, pt was living at home with his mom and is her primary care giver, pt reports he was independent with ADL/IADL and functional mobility. Pt currently completes ADL and functional mobility at a modified independent level demonstrating good use of compensatory/adaptations for visual field deficit. Pt continues to demonstrate visual deficits (see vision section). Recommend follow-up outpatient OT and Opthamology to address visual deficits. No further acute OT needs identified, all further OT needs to be addressed at next venue. OT will sign off.    Follow Up Recommendations  Outpatient OT    Equipment Recommendations  None recommended by OT    Recommendations for Other Services ( followup with Opthamologist)     Precautions / Restrictions Precautions Precautions: Other (comment) Precaution Comments: R visual field cut Restrictions Weight Bearing Restrictions: No      Mobility Bed Mobility Overal bed mobility: Independent                Transfers Overall transfer level: Independent Equipment used: None                  Balance Overall balance assessment: Mild deficits observed, not formally tested                                         ADL either performed or assessed with clinical judgement   ADL Overall ADL's : Modified independent                                       General ADL  Comments: pt appears to be functioning close to baseline level completing ADL with modified independence, requiring more compensatory strategies for visual field cut     Vision Baseline Vision/History: Wears glasses Wears Glasses: Reading only Patient Visual Report: Other (comment)(R visual field cut) Vision Assessment?: Yes Eye Alignment: Impaired (comment)(WFL both eyes uncovered, [place] covered, [place] Rlateral gaze) Ocular Range of Motion: Restricted on the left;Restricted looking up(R eye restricted looking left and up) Alignment/Gaze Preference: Within Defined Limits Tracking/Visual Pursuits: Right eye does not track medially;Decreased smoothness of eye movement to LEFT superior field;Decreased smoothness of eye movement to LEFT inferior field Saccades: Overshoots;Decreased speed of saccadic movement Convergence: Impaired (comment)(R eye does not track medially) Visual Fields: Right visual field deficit(impaired peripheral vision;) Additional Comments: vision intact when looking straight ahead;R temporal field impaired;demonstrates good compensatory strategies of scanning the whole room/environment with head turns;     Perception Perception Perception Tested?: No   Praxis      Pertinent Vitals/Pain Pain Assessment: No/denies pain     Hand Dominance Right   Extremity/Trunk Assessment Upper Extremity Assessment Upper Extremity Assessment: Overall WFL for tasks assessed   Lower Extremity Assessment Lower Extremity Assessment: Overall WFL for tasks assessed   Cervical / Trunk Assessment Cervical /  Trunk Assessment: Normal   Communication Communication Communication: No difficulties(minor studder, reports this is baseline)   Cognition Arousal/Alertness: Awake/alert Behavior During Therapy: WFL for tasks assessed/performed Overall Cognitive Status: Within Functional Limits for tasks assessed Area of Impairment: Memory                     Memory: Decreased short-term  memory         General Comments: discussed difficulty with medication management during the previous week;discussed compensatory/adaptive strategies to address medication management;   General Comments  VSS throughout    Exercises     Shoulder Instructions      Home Living Family/patient expects to be discharged to:: Private residence Living Arrangements: Parent(mother) Available Help at Discharge: Other (Comment)(girlfriend) Type of Home: House Home Access: Ramped entrance     Home Layout: One level                      Lives With: Family(caregiver for elderly mother)    Prior Functioning/Environment Level of Independence: Independent        Comments: Primary caregiver 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        OT Problem List: Impaired vision/perception      OT Treatment/Interventions:      OT Goals(Current goals can be found in the care plan section) Acute Rehab OT Goals Patient Stated Goal: "take care of my mom." OT Goal Formulation: With patient Time For Goal Achievement: 02/17/19 Potential to Achieve Goals: Good  OT Frequency:     Barriers to D/C:            Co-evaluation              AM-PAC OT "6 Clicks" Daily Activity     Outcome Measure Help from another person eating meals?: None Help from another person taking care of personal grooming?: None Help from another person toileting, which includes using toliet, bedpan, or urinal?: None Help from another person bathing (including washing, rinsing, drying)?: None Help from another person to put on and taking off regular upper body clothing?: None Help from another person to put on and taking off regular lower body clothing?: None 6 Click Score: 24   End of Session Nurse Communication: Mobility status  Activity Tolerance: Patient tolerated treatment well Patient left: with call bell/phone within reach;in chair  OT Visit Diagnosis: Low vision, both eyes (H54.2)                Time: 9675-9163 OT Time  Calculation (min): 31 min Charges:  OT General Charges $OT Visit: 1 Visit OT Evaluation $OT Eval Low Complexity: 1 Low OT Treatments $Self Care/Home Management : 8-22 mins  Dorinda Hill OTR/L Acute Rehabilitation Services Office: [place] 02/03/2019, 10:42 AM

## 2019-02-03 NOTE — Evaluation (Signed)
Speech Language Pathology Evaluation Patient Details Name: [name] MRN: [id number] DOB: 1956/03/27 Today's Date: 02/03/2019 Time: 7673-4193 SLP Time Calculation (min) (ACUTE ONLY): 14 min  Problem List:  Patient Active Problem List   Diagnosis Date Noted  . Acute cerebrovascular accident (CVA) ([facility]) 02/02/2019  . TESTICULAR HYPOFUNCTION 04/29/2010  . UPPER RESPIRATORY INFECTION, ACUTE 01/06/2010  . FATIGUE 01/06/2010  . COUGH 01/06/2010  . COPD (chronic obstructive pulmonary disease) ([facility]) 10/29/2009  . ERECTILE DYSFUNCTION, ORGANIC 05/18/2009  . LEG CRAMPS 05/18/2009  . ANKLE PAIN, RIGHT 02/26/2009  . MI 09/27/2008  . Coronary atherosclerosis 09/27/2008  . PERIPHERAL VASCULAR DISEASE WITH CLAUDICATION, LEFT LEG 08/28/2008  . DEGENERATIVE DISC DISEASE, LUMBOSACRAL SPINE 08/28/2008  . LEG PAIN, BILATERAL 08/18/2008  . THYROMEGALY 06/26/2008  . GANGLION CYST, WRIST, LEFT 06/26/2008  . HYPERGLYCEMIA 06/26/2008  . HYPERCHOLESTEROLEMIA 06/06/2008  . CARPAL TUNNEL SYNDROME, RIGHT 09/12/2007  . Essential hypertension 09/12/2007  . PAIN IN JOINT PELVIC REGION AND THIGH 05/23/2007  . PLANTAR FASCIITIS, LEFT 05/23/2007  . DEPRESSION 04/07/2007  . REACTIVE AIRWAY DISEASE 01/21/2007  . ABSCESS, TOOTH 01/21/2007  . SKIN RASH 01/21/2007  . Anxiety state 01/05/2007  . TOBACCO ABUSE 01/05/2007  . INSOMNIA 01/05/2007  . ALCOHOL ABUSE, HX OF 01/05/2007  . LIVER FUNCTION TESTS, ABNORMAL, HX OF 01/05/2007  . DEGENERATIVE DISC DISEASE, CERVICAL SPINE, W/RADICULOPATHY 05/04/2006  . ROTATOR CUFF SYNDROME, LEFT 05/04/2006  . LUNG NODULES 01/02/2006   Past Medical History:  Past Medical History:  Diagnosis Date  . ANXIETY   . CARPAL TUNNEL SYNDROME, RIGHT   . COPD   . Coronary artery disease    s/p bare metal stens X 2 to the RCA, 09/28/2008 EF45-50%.  ath 01/2011 with patent RCA stents and moderate disese elsewhere  . DEGENERATIVE DISC DISEASE, CERVICAL SPINE, W/RADICULOPATHY    . DEPRESSION   . GANGLION CYST, WRIST, LEFT   . HYPERCHOLESTEROLEMIA   . HYPERGLYCEMIA   . Hypertension   . LUNG NODULES   . MI   . PERIPHERAL VASCULAR DISEASE WITH CLAUDICATION, LEFT LEG   . PLANTAR FASCIITIS, LEFT   . REACTIVE AIRWAY DISEASE   . ROTATOR CUFF SYNDROME, LEFT   . TESTICULAR HYPOFUNCTION   . THYROMEGALY    Past Surgical History:  Past Surgical History:  Procedure Laterality Date  . ANGIOPLASTY / STENTING FEMORAL    . CARDIAC CATHETERIZATION     01/2011 cardiac cath with patent stents and normal EF   HPI:  [name] is a 63 y.o. male with medical history significant of coronary artery disease, status post stents to RCA in June 2010, ischemic cardiomyopathy EF of 45 to 50%, hyperlipidemia, hypertension, tobacco abuse, history of alcohol abuse, COPD who presents to the ER with sudden loss of vision in the right eye. MRI showing R caudate nucleus stroke   Assessment / Plan / Recommendation Clinical Impression   Pt presents with grossly intact cognitive-linguistic functioning.  CN exam is unremarkable for areas of focal weakness or sensory loss.  Speech is fluent and free from dysarthria or word finding impairment.  Pt denies any cognitive changes and exhibited grossly intact memory, reasoning/judgment, and problem solving during evaluation.  As a result, no further ST needs are indicated at this time.      SLP Assessment  SLP Recommendation/Assessment: Patient does not need any further Speech Lanaguage Pathology Services    Follow Up Recommendations  None    Frequency and Duration  SLP Evaluation Cognition  Overall Cognitive Status: Within Functional Limits for tasks assessed       Comprehension  Auditory Comprehension Overall Auditory Comprehension: Appears within functional limits for tasks assessed    Expression Expression Primary Mode of Expression: Verbal Verbal Expression Overall Verbal Expression: Appears within functional limits  for tasks assessed   Oral / Motor  Oral Motor/Sensory Function Overall Oral Motor/Sensory Function: Within functional limits Motor Speech Overall Motor Speech: Appears within functional limits for tasks assessed   GO                    Emilio Math 02/03/2019, 9:28 AM

## 2019-02-03 NOTE — Progress Notes (Signed)
STROKE TEAM PROGRESS NOTE     INTERVAL HISTORY Patient states he still has significant vision loss in the right eye which has persisted.  He is only able to see from a temporal field crescent in the right eye.  He denies any new  Symptoms..    OBJECTIVE Vitals:   02/03/19 0300 02/03/19 0450 02/03/19 0854 02/03/19 1248  BP:  125/71 (!) 154/117 (!) 141/64  Pulse: 64 62 80 77  Resp:  14 17 17   Temp:  98.2 F (36.8 C) 98.1 F (36.7 C) 98.1 F (36.7 C)  TempSrc:  Oral Oral Oral  SpO2:  95% 96% 98%    CBC:  Recent Labs  Lab 02/01/19 2250 02/02/19 0805  WBC 4.4 4.5  NEUTROABS 3.0  --   HGB 11.5* 11.9*  HCT 34.8* 35.3*  MCV 90.9 88.9  PLT 221 096    Basic Metabolic Panel:  Recent Labs  Lab 02/01/19 2250 02/02/19 0805  NA 139  --   K 4.0  --   CL 105  --   CO2 25  --   GLUCOSE 85  --   BUN 11  --   CREATININE 0.68 0.71  CALCIUM 9.0  --     Lipid Panel:     Component Value Date/Time   CHOL 130 02/02/2019 0805   CHOL 127 11/05/2018 1504   TRIG 48 02/02/2019 0805   HDL 62 02/02/2019 0805   HDL 61 11/05/2018 1504   CHOLHDL 2.1 02/02/2019 0805   VLDL 10 02/02/2019 0805   LDLCALC 58 02/02/2019 0805   LDLCALC 55 11/05/2018 1504   HgbA1c:  Lab Results  Component Value Date   HGBA1C 5.7 (H) 02/02/2019   Urine Drug Screen:     Component Value Date/Time   LABOPIA POSITIVE (A) 10/14/2006 1702   COCAINSCRNUR NONE DETECTED 10/14/2006 1702   LABBENZ NONE DETECTED 10/14/2006 1702   AMPHETMU NONE DETECTED 10/14/2006 1702   THCU NONE DETECTED 10/14/2006 1702   LABBARB  10/14/2006 1702    NONE DETECTED        DRUG SCREEN FOR MEDICAL PURPOSES ONLY.  IF CONFIRMATION IS NEEDED FOR ANY PURPOSE, NOTIFY LAB WITHIN 5 DAYS.    Alcohol Level No results found for: [facility]  IMAGING  Mr Brain 5 Contrast Mr Angio Head Wo Contrast 02/02/2019 IMPRESSION:  1. Two punctate foci of acute ischemia at the right caudate body. No hemorrhage or mass effect.  2. Old right frontal  subcortical infarct and mild chronic small vessel disease.  3. Severe stenosis of the right P1-2 junction.  4. No emergent large vessel occlusion.   Vas US Carotid (at [place] Only) 02/02/2019 Summary:  Right Carotid: Velocities in the right ICA are consistent with a 40-59% stenosis.  Left Carotid: Velocities in the left ICA are consistent with a 1-39% stenosis.  Vertebrals:  Bilateral vertebral arteries demonstrate antegrade flow.  Subclavians: Normal flow hemodynamics were seen in bilateral subclavian arteries.  Preliminary     Transthoracic Echocardiogram  00/00/2020 Pending Recent Results (from the past 43800 hour(s))  ECHOCARDIOGRAM COMPLETE   Collection Time: 02/02/19  4:22 PM  Result Value   BP 117/73   Narrative     ECHOCARDIOGRAM REPORT       Patient Name:   UMAIR ROSILES [facility] Date of Exam: 02/02/2019 Medical Rec #:  [id number]         Height:       67.5 in Accession #:    [id number]  Weight:       141.6 lb Date of Birth:  1955/08/06         BSA:          1.76 m Patient Age:    63 years          BP:           117/73 mmHg Patient Gender: M                 HR:           64 bpm. Exam Location:  Inpatient  Procedure: 2D Echo  Indications:    stroke 434.91   History:        Patient has prior history of Echocardiogram examinations, most                 recent 10/15/2006. CAD; COPD Risk Factors:Hypertension.   Sonographer:    Delcie RochLauren Pennington Referring Phys: [id number] MOHAMMAD L GARBA    Sonographer Comments: Image acquisition challenging due to respiratory motion. IMPRESSIONS    1. Left ventricular ejection fraction, by visual estimation, is 65 to 70%. The left ventricle has normal function. Normal left ventricular size. There is no left ventricular hypertrophy.  2. Global right ventricle has normal systolic function.The right ventricular size is normal. No increase in right ventricular wall thickness.  3. Left atrial size was normal.  4. Right atrial size was  normal.  5. The mitral valve is normal in structure. No evidence of mitral valve regurgitation. No evidence of mitral stenosis.  6. The tricuspid valve is normal in structure. Tricuspid valve regurgitation is mild.  7. The aortic valve is normal in structure. Aortic valve regurgitation was not visualized by color flow Doppler. Structurally normal aortic valve, with no evidence of sclerosis or stenosis.  8. The pulmonic valve was normal in structure. Pulmonic valve regurgitation is not visualized by color flow Doppler.  9. The inferior vena cava is normal in size with greater than 50% respiratory variability, suggesting right atrial pressure of 3 mmHg. 10. No intracardiac source of emboli.  FINDINGS  Left Ventricle: Left ventricular ejection fraction, by visual estimation, is 65 to 70%. The left ventricle has normal function. There is no left ventricular hypertrophy. Normal left ventricular size. Spectral Doppler shows Left ventricular diastolic  parameters were normal pattern of LV diastolic filling.  Right Ventricle: The right ventricular size is normal. No increase in right ventricular wall thickness. Global RV systolic function is has normal systolic function.  Left Atrium: Left atrial size was normal in size.  Right Atrium: Right atrial size was normal in size  Pericardium: There is no evidence of pericardial effusion.  Mitral Valve: The mitral valve is normal in structure. No evidence of mitral valve stenosis by observation. No evidence of mitral valve regurgitation.  Tricuspid Valve: The tricuspid valve is normal in structure. Tricuspid valve regurgitation is mild by color flow Doppler.  Aortic Valve: The aortic valve is normal in structure. Aortic valve regurgitation was not visualized by color flow Doppler. The aortic valve is structurally normal, with no evidence of sclerosis or stenosis.  Pulmonic Valve: The pulmonic valve was normal in structure. Pulmonic valve regurgitation is  not visualized by color flow Doppler.  Aorta: The aortic root, ascending aorta and aortic arch are all structurally normal, with no evidence of dilitation or obstruction.  Venous: The inferior vena cava is normal in size with greater than 50% respiratory variability, suggesting right atrial pressure of 3 mmHg.  IAS/Shunts:  No atrial level shunt detected by color flow Doppler. No ventricular septal defect is seen or detected. There is no evidence of an atrial septal defect.   Additional Comments: No intracardiac source of emboli.   LEFT VENTRICLE PLAX 2D LVOT diam:     1.80 cm  Diastology LVOT Area:     2.54 cm LV e' lateral:   15.00 cm/s                         LV E/e' lateral: 5.1                         LV e' medial:    9.36 cm/s                         LV E/e' medial:  8.2    RIGHT VENTRICLE RV S prime:     15.60 cm/s TAPSE (M-mode): 2.4 cm  LEFT ATRIUM             Index       RIGHT ATRIUM           Index LA diam:        3.30 cm 1.88 cm/m  RA Area:     14.40 cm LA Vol (A2C):   47.1 ml 26.82 ml/m RA Volume:   34.80 ml  19.82 ml/m LA Vol (A4C):   33.8 ml 19.25 ml/m LA Biplane Vol: 40.4 ml 23.01 ml/m  AORTIC VALVE LVOT Vmax:   89.40 cm/s LVOT Vmean:  55.800 cm/s LVOT VTI:    0.190 m   AORTA Ao Root diam: 3.20 cm  MITRAL VALVE MV Area (PHT): 3.08 cm             SHUNTS MV PHT:        71.34 msec           Systemic VTI:  0.19 m MV Decel Time: 246 msec             Systemic Diam: 1.80 cm MV E velocity: 76.30 cm/s 103 cm/s MV A velocity: 76.30 cm/s 70.3 cm/s MV E/A ratio:  1.00       1.5    Tobias Alexander MD Electronically signed by Tobias Alexander MD Signature Date/Time: 02/02/2019/5:26:03 PM       Final     *Note: Due to a large number of results and/or encounters for the requested time period, some results have not been displayed. A complete set of results can be found in Results Review.     ECG - SR rate   BPM. (See cardiology reading for complete  details)   PHYSICAL EXAM Blood pressure (!) 141/64, pulse 77, temperature 98.1 F (36.7 C), temperature source Oral, resp. rate 17, SpO2 98 %. Pleasant middle-aged Caucasian male not in distress. . Afebrile. Head is nontraumatic. Neck is supple without bruit.    Cardiac exam no murmur or gallop. Lungs are clear to auscultation. Distal pulses are well felt.  Neurological Exam ;  Awake  Alert oriented x 3. Normal speech and language.eye movements full without nystagmus.fundi were not visualized.  Right pupil is sluggishly reactive with afferent pupillary defect.  Left pupil reacts normally vision acuity significantly decreased in the right eye and only able to see through a temporal crescent in the right eye.  Vision acuity is normal in the left eye.  Fields appear normal in the left eye.Marland Kitchen Hearing  is normal. Palatal movements are normal. Face symmetric. Tongue midline. Normal strength, tone, reflexes and coordination. Normal sensation. Gait deferred.     ASSESSMENT/PLAN Mr. LORRIN NAWROT is a 63 y.o. male with history of CAD (stents), ischemic cardiomyopathy, HTN, HLD, tobacco abuse, COPD, anxiety, depression, hx of thyromegaly, hx of lung nodules, hypercholesterolemia and PVD who presented to the ED late Saturday evening with sudden onset of vision loss involving almost all of the visual fields of his right eye, except for a thin crescent of preserved temporal vision.CRAO versus CRVO. He did not receive IV t-PA due to minor deficits - risk outweighed benefits.  Stroke:  Two punctate foci of acute ischemia at the right caudate body.  Which are likely clinically silent.  Resultant right eye near complete vision loss probably secondary to proximal moderate right carotid stenosis and plaque with embolism to retinal artery presumably  Code Stroke CT Head - not ordered  CT head - not ordered  MRI head - Two punctate foci of acute ischemia at the right caudate body. No hemorrhage or mass  effect. 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   MRA head - Severe stenosis of the right P1-2 junction.  CTA H&N - not ordered  CT Perfusion - not ordered  Carotid Doppler - right ICA 40-59% stenosis.   2D Echo - pending  Loyal Jacobson Virus 2 - negative  LDL - 58  HgbA1c - 5.7  UDS - consider ordering  VTE prophylaxis - Lovenox Diet  Diet Order            Diet heart healthy/carb modified Room service appropriate? No; Fluid consistency: Thin  Diet effective now              aspirin 81 mg daily (had been off his Plavix) prior to admission, now on aspirin 81 mg daily and clopidogrel 75 mg daily  Patient counseled to be compliant with his antithrombotic medications  Ongoing aggressive stroke risk factor management  Therapy recommendations:  pending  Disposition:  Pending  Hypertension  Home BP meds: metoprolol  Current BP meds: metoprolol  Stable . Permissive hypertension (OK if < 220/120) but gradually normalize in 5-7 days . Long-term BP goal normotensive  Hyperlipidemia  Home Lipid lowering medication: Lipitor 80 mg daily  LDL 58, goal < 70  Current lipid lowering medication: Lipitor 80 mg daily  Continue statin at discharge  Other Stroke Risk Factors  Advanced age  Former cigarette smoker - quit  Hx stroke/TIA (by imaging)  Coronary artery disease  Cardiomyopathy - echo pending  Medication compliance issues - (was supposed to be on Plavix)   Other Active Problems  Mild anemia   Hospital day # 1    He presented with sudden onset of painless near complete vision loss in the right eye likely due to right central retinal artery occlusion possibly embolic from moderate proximal right carotid stenosis.  Recommend vascular surgery outpatient consult for probable right carotid revascularization.  Discussed with Dr. Clotilde Dieter who agrees with plan.  Aspirin and Plavix long-term given prior history of  cardiac stents.  Aggressive risk factor modification.Discussed with Dr. Jacqulyn Bath   Greater than 50% time during this 25-minute visit was spent on counseling and coordination of care about his vision loss and carotid stenosis and answering questions.  Follow-up as an outpatient with stroke clinic in 6 weeks.  Stroke team will sign off.  Kindly call for questions. Delia Heady, MD Medical Director [facility] D[facility]  [facility] Pager: [phone] 02/03/2019 1:45 PM   To contact Stroke Continuity provider, please refer to [email or web address]. After hours, contact General Neurology

## 2019-02-03 NOTE — Plan of Care (Signed)
Adequate for discharge.

## 2019-02-03 NOTE — Discharge Instructions (Signed)

## 2019-03-19 ENCOUNTER — Other Ambulatory Visit: Payer: Self-pay | Admitting: Cardiology

## 2019-03-19 MED ORDER — CLOPIDOGREL BISULFATE 75 MG PO TABS: ORAL_TABLET | ORAL | 1 refills | Status: DC

## 2019-04-27 ENCOUNTER — Emergency Department (HOSPITAL_COMMUNITY): Payer: No Typology Code available for payment source

## 2019-04-27 ENCOUNTER — Emergency Department (HOSPITAL_COMMUNITY)
Admission: EM | Admit: 2019-04-27 | Discharge: 2019-04-28 | Disposition: A | Discharge status: Home / Self Care | Payer: No Typology Code available for payment source | Attending: Emergency Medicine | Admitting: Emergency Medicine

## 2019-04-27 ENCOUNTER — Other Ambulatory Visit: Payer: Self-pay

## 2019-04-27 ENCOUNTER — Encounter (HOSPITAL_COMMUNITY): Payer: Self-pay | Admitting: Emergency Medicine

## 2019-04-27 DIAGNOSIS — I10 Essential (primary) hypertension: Secondary | ICD-10-CM | POA: Diagnosis not present

## 2019-04-27 DIAGNOSIS — S2232XA Fracture of one rib, left side, initial encounter for closed fracture: Secondary | ICD-10-CM | POA: Diagnosis not present

## 2019-04-27 DIAGNOSIS — Z7982 Long term (current) use of aspirin: Secondary | ICD-10-CM | POA: Diagnosis not present

## 2019-04-27 DIAGNOSIS — Z7902 Long term (current) use of antithrombotics/antiplatelets: Secondary | ICD-10-CM | POA: Insufficient documentation

## 2019-04-27 DIAGNOSIS — S0182XA Laceration with foreign body of other part of head, initial encounter: Secondary | ICD-10-CM | POA: Insufficient documentation

## 2019-04-27 DIAGNOSIS — S0990XA Unspecified injury of head, initial encounter: Secondary | ICD-10-CM | POA: Diagnosis present

## 2019-04-27 DIAGNOSIS — I252 Old myocardial infarction: Secondary | ICD-10-CM | POA: Diagnosis not present

## 2019-04-27 DIAGNOSIS — Y999 Unspecified external cause status: Secondary | ICD-10-CM | POA: Insufficient documentation

## 2019-04-27 DIAGNOSIS — J45909 Unspecified asthma, uncomplicated: Secondary | ICD-10-CM | POA: Diagnosis not present

## 2019-04-27 DIAGNOSIS — J449 Chronic obstructive pulmonary disease, unspecified: Secondary | ICD-10-CM | POA: Diagnosis not present

## 2019-04-27 DIAGNOSIS — Y939 Activity, unspecified: Secondary | ICD-10-CM | POA: Insufficient documentation

## 2019-04-27 DIAGNOSIS — Z87891 Personal history of nicotine dependence: Secondary | ICD-10-CM | POA: Diagnosis not present

## 2019-04-27 DIAGNOSIS — Y92410 Unspecified street and highway as the place of occurrence of the external cause: Secondary | ICD-10-CM | POA: Diagnosis not present

## 2019-04-27 DIAGNOSIS — R079 Chest pain, unspecified: Secondary | ICD-10-CM | POA: Insufficient documentation

## 2019-04-27 DIAGNOSIS — I2510 Atherosclerotic heart disease of native coronary artery without angina pectoris: Secondary | ICD-10-CM | POA: Diagnosis not present

## 2019-04-27 DIAGNOSIS — S0181XA Laceration without foreign body of other part of head, initial encounter: Secondary | ICD-10-CM

## 2019-04-27 DIAGNOSIS — Z79899 Other long term (current) drug therapy: Secondary | ICD-10-CM | POA: Insufficient documentation

## 2019-04-27 LAB — BASIC METABOLIC PANEL
Anion gap: 6 (ref 5–15)
BUN: 10 mg/dL (ref 8–23)
CO2: 27 mmol/L (ref 22–32)
Calcium: 8.8 mg/dL — ABNORMAL LOW (ref 8.9–10.3)
Chloride: 104 mmol/L (ref 98–111)
Creatinine, Ser: 0.68 mg/dL (ref 0.61–1.24)
GFR calc Af Amer: >60 mL/min (ref >60)
GFR calc non Af Amer: >60 mL/min (ref >60)
Glucose, Bld: 108 mg/dL — ABNORMAL HIGH (ref 70–99)
Potassium: 4.5 mmol/L (ref 3.5–5.1)
Sodium: 137 mmol/L (ref 135–145)

## 2019-04-27 LAB — CBC
HCT: 36.9 % — ABNORMAL LOW (ref 39.0–52.0)
Hemoglobin: 12 g/dL — ABNORMAL LOW (ref 13.0–17.0)
MCH: 29.5 pg (ref 26.0–34.0)
MCHC: 32.5 g/dL (ref 30.0–36.0)
MCV: 90.7 fL (ref 80.0–100.0)
Platelets: 206 10*3/uL (ref 150–400)
RBC: 4.07 MIL/uL — ABNORMAL LOW (ref 4.22–5.81)
RDW: 12.7 % (ref 11.5–15.5)
WBC: 8.4 10*3/uL (ref 4.0–10.5)
nRBC: 0 % (ref 0.0–0.2)

## 2019-04-27 LAB — TROPONIN I (HIGH SENSITIVITY): Troponin I (High Sensitivity): 41 ng/L — ABNORMAL HIGH (ref <18)

## 2019-04-27 MED ORDER — SODIUM CHLORIDE 0.9% FLUSH: 3.0000 mL | Freq: Once | INTRAVENOUS | Status: DC
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
## 2019-04-27 NOTE — ED Notes (Signed)
[name], to be called with updates at [phone].

## 2019-04-28 ENCOUNTER — Emergency Department (HOSPITAL_COMMUNITY): Payer: No Typology Code available for payment source

## 2019-04-28 DIAGNOSIS — S2232XA Fracture of one rib, left side, initial encounter for closed fracture: Secondary | ICD-10-CM | POA: Diagnosis not present

## 2019-04-28 LAB — TROPONIN I (HIGH SENSITIVITY): Troponin I (High Sensitivity): 36 ng/L — ABNORMAL HIGH (ref <18)

## 2019-04-28 MED ORDER — OXYCODONE-ACETAMINOPHEN 5-325 MG PO TABS
1.0000 | ORAL_TABLET | ORAL | Status: DC | PRN
  Administered 2019-04-28: 1 via ORAL
  Filled 2019-04-28: qty 1

## 2019-04-28 MED ORDER — OXYCODONE-ACETAMINOPHEN 5-325 MG PO TABS
1.0000 | ORAL_TABLET | Freq: Once | ORAL | Status: AC
  Administered 2019-04-28: 09:00:00 1 via ORAL
  Filled 2019-04-28: qty 1

## 2019-04-28 MED ORDER — LIDOCAINE-EPINEPHRINE (PF) 2 %-1:200000 IJ SOLN
5.0000 mL | Freq: Once | INTRAMUSCULAR | Status: AC
  Administered 2019-04-28: 5 mL
  Filled 2019-04-28: qty 20

## 2019-04-28 MED ORDER — CYCLOBENZAPRINE HCL 10 MG PO TABS: 10.0000 mg | ORAL_TABLET | Freq: Two times a day (BID) | ORAL | 0 refills | Status: AC | PRN

## 2019-04-28 MED ORDER — IOHEXOL 300 MG/ML  SOLN
75.0000 mL | Freq: Once | INTRAMUSCULAR | Status: AC | PRN
  Administered 2019-04-28: 75 mL via INTRAVENOUS

## 2019-04-28 NOTE — Discharge Instructions (Signed)
Please read instructions below. Apply ice to your areas of pain for 20 minutes at a time. You can take 600 mg of Advil/ibuprofen every 6 hours as needed for pain. You can take flexeril every 12 hours as needed for muscle spasm. Be aware this medication can make you drowsy. Keep your wound clean; gently clean it with soap and water daily. Report to your PCP for suture removal in about 5 to 7 days. Schedule an appointment with your primary care provider to follow up on your visit today. Return to the ER for signs of infection to your wound, difficulty breathing, severe headache, vision changes, if new numbness or tingling in your arms or legs, inability to urinate, inability to hold your bowels, or weakness in your extremities.

## 2019-04-28 NOTE — ED Notes (Signed)
Patient transported to CT 

## 2019-04-28 NOTE — ED Notes (Signed)
Lidocaine at bedside.
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
## 2019-06-18 ENCOUNTER — Other Ambulatory Visit: Payer: Self-pay | Admitting: Cardiology

## 2019-06-23 ENCOUNTER — Ambulatory Visit: Payer: Medicaid Other | Attending: Internal Medicine

## 2019-06-23 DIAGNOSIS — Z23 Encounter for immunization: Secondary | ICD-10-CM

## 2019-06-23 NOTE — Progress Notes (Signed)
   Covid-19 Vaccination Clinic  Name:  [name]    MRN: [id number] DOB: 1955-10-05  06/23/2019  [name] was observed post Covid-19 immunization for 15 minutes without incidence. He was provided with Vaccine Information Sheet and instruction to access the V-Safe system.   [name] was instructed to call 911 with any severe reactions post vaccine: Marland Kitchen Difficulty breathing  . Swelling of your face and throat  . A fast heartbeat  . A bad rash all over your body  . Dizziness and weakness    Immunizations Administered    Name Date Dose VIS Date Route   Pfizer COVID-19 Vaccine 06/23/2019 11:28 AM 0.3 mL 04/04/2019 Intramuscular   Manufacturer: [employer], [employer]   Lot: [id number]   NDC: [address]-6

## 2019-07-22 ENCOUNTER — Ambulatory Visit: Payer: Medicaid Other | Attending: Internal Medicine

## 2019-07-22 DIAGNOSIS — Z23 Encounter for immunization: Secondary | ICD-10-CM

## 2019-07-22 NOTE — Progress Notes (Signed)
   Covid-19 Vaccination Clinic  Name:  ALEEM ELZA    MRN: [id number] DOB: 1955/10/14  07/22/2019  [name] was observed post Covid-19 immunization for 15 minutes without incident. He was provided with Vaccine Information Sheet and instruction to access the V-Safe system.   [name] was instructed to call 911 with any severe reactions post vaccine: Marland Kitchen Difficulty breathing  . Swelling of face and throat  . A fast heartbeat  . A bad rash all over body  . Dizziness and weakness   Immunizations Administered    Name Date Dose VIS Date Route   Pfizer COVID-19 Vaccine 07/22/2019 10:58 AM 0.3 mL 04/04/2019 Intramuscular   Manufacturer: [employer], [employer]   Lot: [id number]   NDC: [address]-1

## 2019-09-20 ENCOUNTER — Other Ambulatory Visit: Payer: Self-pay | Admitting: Cardiology

## 2019-09-23 ENCOUNTER — Other Ambulatory Visit: Payer: Self-pay

## 2019-09-23 MED ORDER — CLOPIDOGREL BISULFATE 75 MG PO TABS: 75.0000 mg | ORAL_TABLET | Freq: Every day | ORAL | 3 refills | Status: DC

## 2019-10-05 NOTE — Progress Notes (Signed)
Virtual Visit via Telephone  Note   This visit type was conducted due to national recommendations for restrictions regarding the COVID-19 Pandemic (e.g. social distancing) in an effort to limit this patient's exposure and mitigate transmission in our community.  Due to his co-morbid illnesses, this patient is at least at moderate risk for complications without adequate follow up.  This format is felt to be most appropriate for this patient at this time.  All issues noted in this document were discussed and addressed.  A limited physical exam was performed with this format.  Please refer to the patient's chart for his consent to telehealth for [facility].   Evaluation Performed:  Follow-up visit  This visit type was conducted due to national recommendations for restrictions regarding the COVID-19 Pandemic (e.g. social distancing).  This format is felt to be most appropriate for this patient at this time.  All issues noted in this document were discussed and addressed.  No physical exam was performed (except for noted visual exam findings with Video Visits).  Please refer to the patient's chart (MyChart message for video visits and phone note for telephone visits) for the patient's consent to telehealth for [facility].  Date:  10/06/2019   ID:  [name], DOB 1956-02-28, MRN [id number]  Patient Location:  Home  Provider location:   [place]  PCP:  Charlies Silvers, PA-C  Cardiologist:  Armanda Magic, MD  Electrophysiologist:  None   Chief Complaint:  CAD, HTN, lipids  History of Present Illness:    [name] is a 64 y.o. male who presents via audio/video conferencing for a telehealth visit today.    Wilbert Hayashi [facility] a 63 y.o.malewith a hx of CAD (s/p BMS x2 to RCA with EF 45-50%, repeat cath 01/2011 with patent stents and moderate disease elsewhere), HTN, dyslipidemia and PVD.  He is here today for followup and is doing well.  He denies any chest pain or  pressure, SOB, DOE, PND, orthopnea, LE edema, dizziness, palpitations or syncope. He is compliant with his meds and is tolerating meds with no SE.    The patient does not have symptoms concerning for COVID-19 infection (fever, chills, cough, or new shortness of breath).    Prior CV studies:   The following studies were reviewed today:  none  Past Medical History:  Diagnosis Date  . ANXIETY   . CARPAL TUNNEL SYNDROME, RIGHT   . COPD   . Coronary artery disease    s/p bare metal stens X 2 to the RCA, 09/28/2008 EF45-50%.  ath 01/2011 with patent RCA stents and moderate disese elsewhere  . DEGENERATIVE DISC DISEASE, CERVICAL SPINE, W/RADICULOPATHY   . DEPRESSION   . GANGLION CYST, WRIST, LEFT   . HYPERCHOLESTEROLEMIA   . HYPERGLYCEMIA   . Hypertension   . LUNG NODULES   . MI   . PERIPHERAL VASCULAR DISEASE WITH CLAUDICATION, LEFT LEG   . PLANTAR FASCIITIS, LEFT   . REACTIVE AIRWAY DISEASE   . ROTATOR CUFF SYNDROME, LEFT   . TESTICULAR HYPOFUNCTION   . THYROMEGALY    Past Surgical History:  Procedure Laterality Date  . ANGIOPLASTY / STENTING FEMORAL    . CARDIAC CATHETERIZATION     01/2011 cardiac cath with patent stents and normal EF     Current Meds  Medication Sig  . amphetamine-dextroamphetamine (ADDERALL) 15 MG tablet Take 15 mg by mouth daily.  . Ascorbic Acid (VITAMIN C) 1000 MG tablet Take 1,000 mg by mouth at bedtime.   Marland Kitchen  aspirin 81 MG tablet Take 1 tablet (81 mg total) by mouth every morning. (Patient taking differently: Take 81 mg by mouth at bedtime. )  . atorvastatin (LIPITOR) 80 MG tablet TAKE 1 TABLET(80 MG) BY MOUTH DAILY (Patient taking differently: Take 80 mg by mouth at bedtime. )  . B Complex Vitamins (VITAMIN B-COMPLEX PO) Take 1 tablet by mouth daily.  . cholecalciferol (VITAMIN D) 1000 UNITS tablet Take 1,000 Units by mouth every evening.    . clopidogrel (PLAVIX) 75 MG tablet Take 1 tablet (75 mg total) by mouth daily.  Marland Kitchen gabapentin (NEURONTIN) 600 MG  tablet Take 600 mg by mouth 3 (three) times daily.    . isosorbide mononitrate (IMDUR) 30 MG 24 hr tablet TAKE 1 TABLET BY MOUTH EVERY DAY (Patient taking differently: Take 30 mg by mouth daily. )  . metoprolol tartrate (LOPRESSOR) 50 MG tablet TAKE 1/2 TABLET(25 MG) BY MOUTH TWICE DAILY (Patient taking differently: Take 25 mg by mouth 2 (two) times daily. )  . nitroGLYCERIN (NITROSTAT) 0.6 MG SL tablet PLACE 1 TABLET UNDER THE TONGUE EVERY 5 MINUTES AS NEEDED FOR CHEST PAIN (Patient taking differently: Place 0.6 mg under the tongue every 5 (five) minutes as needed for chest pain. )  . SUBOXONE 8-2 MG FILM Place 0.5-1 application under the tongue daily. 1 in the morning and 1/2 in the afternoon     Allergies:   Nsaids   Social History   Tobacco Use  . Smoking status: Former Games developer  . Smokeless tobacco: Never Used  Vaping Use  . Vaping Use: Never used  Substance Use Topics  . Alcohol use: No  . Drug use: No     Family Hx: The patient's family history includes Diabetes in his father; Heart failure in his father.  ROS:   Please see the history of present illness.     All other systems reviewed and are negative.   Labs/Other Tests and Data Reviewed:    Recent Labs: 02/01/2019: ALT 26 04/27/2019: BUN 10; Creatinine, Ser 0.68; Hemoglobin 12.0; Platelets 206; Potassium 4.5; Sodium 137   Recent Lipid Panel Lab Results  Component Value Date/Time   CHOL 130 02/02/2019 08:05 AM   CHOL 127 11/05/2018 03:04 PM   TRIG 48 02/02/2019 08:05 AM   HDL 62 02/02/2019 08:05 AM   HDL 61 11/05/2018 03:04 PM   CHOLHDL 2.1 02/02/2019 08:05 AM   LDLCALC 58 02/02/2019 08:05 AM   LDLCALC 55 11/05/2018 03:04 PM    Wt Readings from Last 3 Encounters:  10/06/19 149 lb (67.6 kg)  10/09/17 141 lb 9.6 oz (64.2 kg)  09/11/16 143 lb 3.2 oz (65 kg)     Objective:    Vital Signs:  BP 130/85   Pulse 69   Ht 5' 7.5" (1.715 m)   Wt 149 lb (67.6 kg)   BMI 22.99 kg/m     ASSESSMENT & PLAN:    1.   ASCAD  -s/p BMS x2 to RCA with EF 45-50%, repeat cath 01/2011 with patent stents and moderate disease elsewhere.   -he denies any anginal symptoms -continue ASA 81mg  daily, Plavix 75mg  daily, BB, long acting nitrate and statin  2.  Hypertension  -BP controlled on exam -continue Lopressor 25mg  BID  3.  Hyperlipidemia  - his LDL goal is less than 70. -LDL 58 in Oct 2020  -continue atorvastatin 80mg  daily   4.  Carotid artery stenosis -carotid dopplers 01/2019 showed 40-59% right and 1-39% left carotid stenosis -repeat dopplers  01/2020 -continue ASA and statin  COVID-19 Education: The signs and symptoms of COVID-19 were discussed with the patient and how to seek care for testing (follow up with PCP or arrange E-visit).  The importance of social distancing was discussed today.  Patient Risk:   After full review of this patient's clinical status, I feel that they are at least moderate risk at this time.  Time:   Today, I have spent 20 minutes on telemedicine discussing medical problems including CAD< HTN, HLD and reviewing patient's chart including outside labs from PCP.  Medication Adjustments/Labs and Tests Ordered: Current medicines are reviewed at length with the patient today.  Concerns regarding medicines are outlined above.  Tests Ordered: No orders of the defined types were placed in this encounter.  Medication Changes: No orders of the defined types were placed in this encounter.   Disposition:  Follow up in 1 year(s)  Signed, Fransico Him, MD  10/06/2019 9:33 AM    [facility]

## 2019-10-06 ENCOUNTER — Other Ambulatory Visit: Payer: Self-pay

## 2019-10-06 ENCOUNTER — Other Ambulatory Visit: Payer: Self-pay | Admitting: *Deleted

## 2019-10-06 ENCOUNTER — Telehealth (INDEPENDENT_AMBULATORY_CARE_PROVIDER_SITE_OTHER): Payer: Medicare Other | Admitting: Cardiology

## 2019-10-06 ENCOUNTER — Encounter: Payer: Self-pay | Admitting: Cardiology

## 2019-10-06 VITALS — BP 130/85 | HR 69 | Ht 67.5 in | Wt 149.0 lb

## 2019-10-06 DIAGNOSIS — I6521 Occlusion and stenosis of right carotid artery: Secondary | ICD-10-CM | POA: Diagnosis not present

## 2019-10-06 DIAGNOSIS — I10 Essential (primary) hypertension: Secondary | ICD-10-CM

## 2019-10-06 DIAGNOSIS — E7800 Pure hypercholesterolemia, unspecified: Secondary | ICD-10-CM | POA: Diagnosis not present

## 2019-10-06 DIAGNOSIS — I2510 Atherosclerotic heart disease of native coronary artery without angina pectoris: Secondary | ICD-10-CM

## 2019-10-06 MED ORDER — NITROGLYCERIN 0.6 MG SL SUBL: SUBLINGUAL_TABLET | SUBLINGUAL | 3 refills | Status: DC

## 2019-10-06 MED ORDER — ISOSORBIDE MONONITRATE ER 30 MG PO TB24: 30.0000 mg | ORAL_TABLET | Freq: Every day | ORAL | 3 refills | Status: DC

## 2019-10-06 NOTE — Patient Instructions (Signed)
Medication Instructions:  Your physician recommends that you continue on your current medications as directed. Please refer to the Current Medication list given to you today.  *If you need a refill on your cardiac medications before your next appointment, please call your pharmacy*   Testing/Procedures: Your physician has requested that you have a carotid duplex. This test is an ultrasound of the carotid arteries in your neck. It looks at blood flow through these arteries that supply the brain with blood. Allow one hour for this exam. There are no restrictions or special instructions.   Follow-Up: At [facility], you and your health needs are our priority.  As part of our continuing mission to provide you with exceptional heart care, we have created designated Provider Care Teams.  These Care Teams include your primary Cardiologist (physician) and Advanced Practice Providers (APPs -  Physician Assistants and Nurse Practitioners) who all work together to provide you with the care you need, when you need it.  Your next appointment:   1 year(s)  The format for your next appointment:   Either In Person or Virtual  Provider:   You may see Armanda Magic, MD or one of the following Advanced Practice Providers on your designated Care Team:    Ronie Spies, PA-C  Jacolyn Reedy, PA-C

## 2019-10-06 NOTE — Addendum Note (Signed)
Addended by: Theresia Majors on: 10/06/2019 09:50 AM   Modules accepted: Orders

## 2019-10-06 NOTE — Addendum Note (Signed)
Addended by: Theresia Majors on: 10/06/2019 09:51 AM   Modules accepted: Orders

## 2019-10-17 ENCOUNTER — Other Ambulatory Visit: Payer: Self-pay | Admitting: Physician Assistant

## 2019-12-14 ENCOUNTER — Other Ambulatory Visit: Payer: Self-pay | Admitting: Cardiology

## 2020-01-14 ENCOUNTER — Other Ambulatory Visit: Payer: Self-pay | Admitting: Cardiology

## 2020-02-01 ENCOUNTER — Other Ambulatory Visit: Payer: Self-pay | Admitting: Cardiology

## 2020-02-07 ENCOUNTER — Ambulatory Visit: Payer: Medicare Other | Attending: Internal Medicine

## 2020-02-07 DIAGNOSIS — Z23 Encounter for immunization: Secondary | ICD-10-CM

## 2020-02-07 NOTE — Progress Notes (Signed)
   Covid-19 Vaccination Clinic  Name:  [name]    MRN: [id number] DOB: [birth year]  02/07/2020  [name] was observed post Covid-19 immunization for 15 minutes without incident. He was provided with Vaccine Information Sheet and instruction to access the V-Safe system.   [name] was instructed to call 911 with any severe reactions post vaccine: Marland Kitchen Difficulty breathing  . Swelling of face and throat  . A fast heartbeat  . A bad rash all over body  . Dizziness and weakness

## 2020-02-09 ENCOUNTER — Ambulatory Visit (HOSPITAL_COMMUNITY): Payer: Medicare Other

## 2020-02-09 ENCOUNTER — Encounter (HOSPITAL_COMMUNITY): Payer: Medicare Other

## 2020-02-23 ENCOUNTER — Ambulatory Visit (HOSPITAL_COMMUNITY)
Admission: RE | Admit: 2020-02-23 | Discharge: 2020-02-23 | Disposition: A | Discharge status: Home / Self Care | Payer: Medicare Other | Source: Ambulatory Visit | Attending: Cardiology | Admitting: Cardiology

## 2020-02-23 ENCOUNTER — Other Ambulatory Visit: Payer: Self-pay | Admitting: Cardiology

## 2020-02-23 ENCOUNTER — Other Ambulatory Visit: Payer: Self-pay

## 2020-02-23 DIAGNOSIS — I6521 Occlusion and stenosis of right carotid artery: Secondary | ICD-10-CM | POA: Insufficient documentation

## 2020-02-24 ENCOUNTER — Encounter: Payer: Self-pay | Admitting: Cardiology

## 2020-02-24 DIAGNOSIS — I6529 Occlusion and stenosis of unspecified carotid artery: Secondary | ICD-10-CM | POA: Insufficient documentation

## 2020-06-03 ENCOUNTER — Other Ambulatory Visit: Payer: Self-pay | Admitting: Cardiology

## 2020-07-29 ENCOUNTER — Other Ambulatory Visit: Payer: Self-pay | Admitting: Cardiology

## 2020-07-29 DIAGNOSIS — I2510 Atherosclerotic heart disease of native coronary artery without angina pectoris: Secondary | ICD-10-CM

## 2020-09-15 ENCOUNTER — Other Ambulatory Visit: Payer: Self-pay | Admitting: Cardiology

## 2020-09-17 ENCOUNTER — Other Ambulatory Visit: Payer: Self-pay | Admitting: Cardiology

## 2020-09-26 ENCOUNTER — Other Ambulatory Visit: Payer: Self-pay | Admitting: Cardiology

## 2020-09-27 ENCOUNTER — Other Ambulatory Visit: Payer: Self-pay | Admitting: Cardiology

## 2020-10-24 ENCOUNTER — Other Ambulatory Visit: Payer: Self-pay | Admitting: Cardiology

## 2020-10-24 DIAGNOSIS — I2510 Atherosclerotic heart disease of native coronary artery without angina pectoris: Secondary | ICD-10-CM

## 2020-11-02 ENCOUNTER — Other Ambulatory Visit: Payer: Self-pay | Admitting: Cardiology

## 2020-11-12 ENCOUNTER — Other Ambulatory Visit: Payer: Self-pay

## 2020-11-12 ENCOUNTER — Encounter: Payer: Self-pay | Admitting: Cardiology

## 2020-11-12 ENCOUNTER — Ambulatory Visit (INDEPENDENT_AMBULATORY_CARE_PROVIDER_SITE_OTHER): Payer: Medicare Other | Admitting: Cardiology

## 2020-11-12 VITALS — BP 110/66 | HR 74 | Ht 67.5 in | Wt 137.0 lb

## 2020-11-12 DIAGNOSIS — I6523 Occlusion and stenosis of bilateral carotid arteries: Secondary | ICD-10-CM

## 2020-11-12 DIAGNOSIS — E7800 Pure hypercholesterolemia, unspecified: Secondary | ICD-10-CM

## 2020-11-12 DIAGNOSIS — I2583 Coronary atherosclerosis due to lipid rich plaque: Secondary | ICD-10-CM

## 2020-11-12 DIAGNOSIS — I10 Essential (primary) hypertension: Secondary | ICD-10-CM | POA: Diagnosis not present

## 2020-11-12 DIAGNOSIS — I2510 Atherosclerotic heart disease of native coronary artery without angina pectoris: Secondary | ICD-10-CM | POA: Diagnosis not present

## 2020-11-12 MED ORDER — ISOSORBIDE MONONITRATE ER 30 MG PO TB24: 30.0000 mg | ORAL_TABLET | Freq: Every day | ORAL | 3 refills | Status: DC

## 2020-11-12 MED ORDER — CLOPIDOGREL BISULFATE 75 MG PO TABS: ORAL_TABLET | ORAL | 3 refills | Status: DC

## 2020-11-12 MED ORDER — METOPROLOL TARTRATE 50 MG PO TABS: ORAL_TABLET | ORAL | 3 refills | Status: DC

## 2020-11-12 MED ORDER — ATORVASTATIN CALCIUM 80 MG PO TABS: 80.0000 mg | ORAL_TABLET | Freq: Every day | ORAL | 3 refills | Status: DC

## 2020-11-12 NOTE — Patient Instructions (Signed)
Medication Instructions:  Your physician recommends that you continue on your current medications as directed. Please refer to the Current Medication list given to you today.  *If you need a refill on your cardiac medications before your next appointment, please call your pharmacy*   Lab Work: Come back fasting for FLP and ALT If you have labs (blood work) drawn today and your tests are completely normal, you will receive your results only by: MyChart Message (if you have MyChart) OR A paper copy in the mail If you have any lab test that is abnormal or we need to change your treatment, we will call you to review the results.   Testing/Procedures: Your physician has requested that you have a carotid duplex in November 2022. This test is an ultrasound of the carotid arteries in your neck. It looks at blood flow through these arteries that supply the brain with blood. Allow one hour for this exam. There are no restrictions or special instructions.  Follow-Up: At [facility], you and your health needs are our priority.  As part of our continuing mission to provide you with exceptional heart care, we have created designated Provider Care Teams.  These Care Teams include your primary Cardiologist (physician) and Advanced Practice Providers (APPs -  Physician Assistants and Nurse Practitioners) who all work together to provide you with the care you need, when you need it.  Your next appointment:   1 year(s)  The format for your next appointment:   In Person  Provider:   You may see Armanda Magic, MD or one of the following Advanced Practice Providers on your designated Care Team:   Ronie Spies, PA-C Jacolyn Reedy, PA-C

## 2020-11-12 NOTE — Addendum Note (Signed)
Addended by: Theresia Majors on: 11/12/2020 03:54 PM   Modules accepted: Orders

## 2020-11-12 NOTE — Progress Notes (Signed)
Date:  11/12/2020   ID:  [name], DOB 1955-12-24, MRN [id number]   PCP:  Charlies Silvers, PA-C  Cardiologist:  Armanda Magic, MD  Electrophysiologist:  None   Chief Complaint:  CAD, HTN, lipids  History of Present Illness:    [name] is a 65 y.o. male  with a hx of CAD (s/p BMS x2 to RCA with EF 45-50%, repeat cath 01/2011 with patent stents and moderate disease elsewhere), HTN, dyslipidemia and PVD.  He is here today for followup and is doing well.  He denies any chest pain or pressure, SOB, DOE, PND, orthopnea, LE edema, dizziness, palpitations or syncope. He is compliant with his meds and is tolerating meds with no SE.    Prior CV studies:   The following studies were reviewed today:  none  Past Medical History:  Diagnosis Date   ANXIETY    Carotid artery stenosis    40-59% bilateral stenosis   CARPAL TUNNEL SYNDROME, RIGHT    COPD    Coronary artery disease    s/p bare metal stens X 2 to the RCA, 09/28/2008 EF45-50%.  ath 01/2011 with patent RCA stents and moderate disese elsewhere   DEGENERATIVE DISC DISEASE, CERVICAL SPINE, W/RADICULOPATHY    DEPRESSION    GANGLION CYST, WRIST, LEFT    HYPERCHOLESTEROLEMIA    HYPERGLYCEMIA    Hypertension    LUNG NODULES    MI    PERIPHERAL VASCULAR DISEASE WITH CLAUDICATION, LEFT LEG    PLANTAR FASCIITIS, LEFT    REACTIVE AIRWAY DISEASE    ROTATOR CUFF SYNDROME, LEFT    TESTICULAR HYPOFUNCTION    THYROMEGALY    Past Surgical History:  Procedure Laterality Date   ANGIOPLASTY / STENTING FEMORAL     CARDIAC CATHETERIZATION     01/2011 cardiac cath with patent stents and normal EF     Current Meds  Medication Sig   amphetamine-dextroamphetamine (ADDERALL) 15 MG tablet Take 15 mg by mouth daily.   Ascorbic Acid (VITAMIN C) 1000 MG tablet Take 1,000 mg by mouth at bedtime.    aspirin 81 MG tablet Take 1 tablet (81 mg total) by mouth every morning.   atorvastatin (LIPITOR) 80 MG tablet Take 1 tablet (80 mg  total) by mouth daily. Please make yearly appt with Dr. Mayford Knife for June 2022 for future refills. Thank you 1st attempt   B Complex Vitamins (VITAMIN B-COMPLEX PO) Take 1 tablet by mouth daily.   cholecalciferol (VITAMIN D) 1000 UNITS tablet Take 1,000 Units by mouth every evening.     clopidogrel (PLAVIX) 75 MG tablet TAKE 1 TABLET(75 MG) BY MOUTH DAILY   cyclobenzaprine (FLEXERIL) 10 MG tablet Take 1 tablet (10 mg total) by mouth 2 (two) times daily as needed for muscle spasms.   famotidine (PEPCID) 10 MG tablet Take 10 mg by mouth as needed for heartburn or indigestion.   gabapentin (NEURONTIN) 600 MG tablet Take 600 mg by mouth 3 (three) times daily.     isosorbide mononitrate (IMDUR) 30 MG 24 hr tablet Take 1 tablet (30 mg total) by mouth daily. Pleas keep upcoming appointment in July 2022 for future refills. Thank you   metoprolol tartrate (LOPRESSOR) 50 MG tablet TAKE 1/2 TABLET(25 MG) BY MOUTH TWICE DAILY   nitroGLYCERIN (NITROSTAT) 0.6 MG SL tablet PLACE 1 TABLET UNDER TONGUE EVERY 5 MINUTES AS NEEDED FOR CHEST PAIN   SUBOXONE 8-2 MG FILM Place 0.5-1 application under the tongue daily. 1 in the morning and 1/2 in  the afternoon     Allergies:   Nsaids   Social History   Tobacco Use   Smoking status: Former   Smokeless tobacco: Never  Building services engineer Use: Never used  Substance Use Topics   Alcohol use: No   Drug use: No     Family Hx: The patient's family history includes Diabetes in his father; Heart failure in his father.  ROS:   Please see the history of present illness.     All other systems reviewed and are negative.   Labs/Other Tests and Data Reviewed:    Recent Labs: No results found for requested labs within last 8760 hours.   Recent Lipid Panel Lab Results  Component Value Date/Time   CHOL 130 02/02/2019 08:05 AM   CHOL 127 11/05/2018 03:04 PM   TRIG 48 02/02/2019 08:05 AM   HDL 62 02/02/2019 08:05 AM   HDL 61 11/05/2018 03:04 PM   CHOLHDL 2.1  02/02/2019 08:05 AM   LDLCALC 58 02/02/2019 08:05 AM   LDLCALC 55 11/05/2018 03:04 PM    Wt Readings from Last 3 Encounters:  11/12/20 137 lb (62.1 kg)  10/06/19 149 lb (67.6 kg)  10/09/17 141 lb 9.6 oz (64.2 kg)     Objective:    Vital Signs:  BP 110/66   Pulse 74   Ht 5' 7.5" (1.715 m)   Wt 137 lb (62.1 kg)   SpO2 95%   BMI 21.14 kg/m   GEN: Well nourished, well developed in no acute distress HEENT: Normal NECK: No JVD; No carotid bruits LYMPHATICS: No lymphadenopathy CARDIAC:RRR, no murmurs, rubs, gallops RESPIRATORY:  Clear to auscultation without rales, wheezing or rhonchi  ABDOMEN: Soft, non-tender, non-distended MUSCULOSKELETAL:  No edema; No deformity  SKIN: Warm and dry NEUROLOGIC:  Alert and oriented x 3 PSYCHIATRIC:  Normal affect    EKG was performed in the office today and showed NSR with no ST changes ASSESSMENT & PLAN:    1.  ASCAD  -s/p BMS x2 to RCA with EF 45-50%, repeat cath 01/2011 with patent stents and moderate disease elsewhere.   -he has not had any anginal symptoms -Continue prescription drug management with ASA 81mg  daily, Plavix 75mg  daily, Imdur 30mg  daily, Lopressor 25mg  BID and Atorvastatin 80mg  daily>refilled for 1 year   2.  Hypertension  -Bp controlled on exam today -Continue prescription drug management with Lopressor 25mg  BID>>refilled for 1 year   3.  Hyperlipidemia  -his LDL goal is less than 70. -LDL 58 in Oct 2020  -Continue prescription drug management with Atorvastatin 80mg  daily > refilled for 1 year -check FLP and ALT  4.  Carotid artery stenosis -carotid dopplers 02/2020 showed 40-59% bilaterally -repeat dopplers 02/2021 -continue ASA and statin  Medication Adjustments/Labs and Tests Ordered: Current medicines are reviewed at length with the patient today.  Concerns regarding medicines are outlined above.  Tests Ordered: Orders Placed This Encounter  Procedures   EKG 12-Lead    Medication Changes: No orders of  the defined types were placed in this encounter.   Disposition:  Follow up in 1 year(s)  Signed, , MD  11/12/2020 3:46 PM    [facility]

## 2020-11-25 ENCOUNTER — Other Ambulatory Visit: Payer: Medicare Other

## 2020-11-25 ENCOUNTER — Other Ambulatory Visit: Payer: Self-pay

## 2020-11-25 DIAGNOSIS — E7800 Pure hypercholesterolemia, unspecified: Secondary | ICD-10-CM

## 2020-11-26 LAB — LIPID PANEL
Chol/HDL Ratio: 2.1 ratio (ref 0.0–5.0)
Cholesterol, Total: 119 mg/dL (ref 100–199)
HDL: 57 mg/dL (ref >39)
LDL Chol Calc (NIH): 51 mg/dL (ref 0–99)
Triglycerides: 44 mg/dL (ref 0–149)
VLDL Cholesterol Cal: 11 mg/dL (ref 5–40)

## 2020-11-26 LAB — ALT: ALT: 29 IU/L (ref 0–44)

## 2020-12-16 ENCOUNTER — Other Ambulatory Visit: Payer: Self-pay | Admitting: Cardiology

## 2021-01-30 ENCOUNTER — Other Ambulatory Visit: Payer: Self-pay | Admitting: Cardiology

## 2021-01-30 DIAGNOSIS — I2510 Atherosclerotic heart disease of native coronary artery without angina pectoris: Secondary | ICD-10-CM

## 2021-01-31 ENCOUNTER — Other Ambulatory Visit: Payer: Self-pay | Admitting: Cardiology

## 2021-01-31 DIAGNOSIS — I2510 Atherosclerotic heart disease of native coronary artery without angina pectoris: Secondary | ICD-10-CM

## 2021-03-09 ENCOUNTER — Ambulatory Visit (HOSPITAL_COMMUNITY): Payer: Medicare Other

## 2021-03-16 ENCOUNTER — Encounter (HOSPITAL_COMMUNITY): Payer: Medicare Other

## 2021-08-30 ENCOUNTER — Other Ambulatory Visit: Payer: Self-pay | Admitting: Cardiology
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
## 2021-10-27 ENCOUNTER — Other Ambulatory Visit: Payer: Self-pay | Admitting: Cardiology

## 2021-10-27 DIAGNOSIS — I2510 Atherosclerotic heart disease of native coronary artery without angina pectoris: Secondary | ICD-10-CM

## 2021-10-31 ENCOUNTER — Other Ambulatory Visit (HOSPITAL_COMMUNITY): Payer: Self-pay | Admitting: Physician Assistant

## 2021-10-31 DIAGNOSIS — I779 Disorder of arteries and arterioles, unspecified: Secondary | ICD-10-CM

## 2021-11-15 ENCOUNTER — Ambulatory Visit (HOSPITAL_COMMUNITY)
Admission: RE | Admit: 2021-11-15 | Discharge: 2021-11-15 | Disposition: A | Discharge status: Home / Self Care | Payer: Medicare Other | Source: Ambulatory Visit | Attending: Cardiovascular Disease | Admitting: Cardiovascular Disease

## 2021-11-15 DIAGNOSIS — I6523 Occlusion and stenosis of bilateral carotid arteries: Secondary | ICD-10-CM

## 2021-11-15 DIAGNOSIS — I779 Disorder of arteries and arterioles, unspecified: Secondary | ICD-10-CM | POA: Insufficient documentation

## 2021-11-17 ENCOUNTER — Encounter: Payer: Self-pay | Admitting: Cardiology

## 2021-11-26 ENCOUNTER — Other Ambulatory Visit: Payer: Self-pay | Admitting: Cardiology

## 2021-12-16 ENCOUNTER — Other Ambulatory Visit: Payer: Self-pay | Admitting: Cardiology

## 2021-12-22 ENCOUNTER — Ambulatory Visit: Payer: Medicare Other | Admitting: Cardiology

## 2022-01-02 ENCOUNTER — Ambulatory Visit: Payer: Medicare Other | Attending: Cardiology | Admitting: Physician Assistant

## 2022-01-02 ENCOUNTER — Encounter: Payer: Self-pay | Admitting: Physician Assistant

## 2022-01-02 VITALS — BP 108/66 | HR 74 | Ht 67.5 in | Wt 137.0 lb

## 2022-01-02 DIAGNOSIS — I2583 Coronary atherosclerosis due to lipid rich plaque: Secondary | ICD-10-CM | POA: Diagnosis present

## 2022-01-02 DIAGNOSIS — I10 Essential (primary) hypertension: Secondary | ICD-10-CM | POA: Diagnosis present

## 2022-01-02 DIAGNOSIS — I2510 Atherosclerotic heart disease of native coronary artery without angina pectoris: Secondary | ICD-10-CM

## 2022-01-02 DIAGNOSIS — E7800 Pure hypercholesterolemia, unspecified: Secondary | ICD-10-CM

## 2022-01-02 DIAGNOSIS — I779 Disorder of arteries and arterioles, unspecified: Secondary | ICD-10-CM

## 2022-01-02 NOTE — Progress Notes (Signed)
Cardiology Office Note:    Date:  01/02/2022   ID:  [name], DOB 10/23/1955, MRN [id number]  PCP:  Alex Silvers, PA-C  [facility] Cardiologist:  Alex Magic, MD  [facility] Electrophysiologist:  None   Chief Complaint: yearly follow up   History of Present Illness:    [name] is a 66 y.o. male with a hx of CAD, HTN, HLD, right eye stroke and carotid artery dz seen for follow up   Hx of CAD s/p BMS x2 to RCA in 2010 with EF 45-50%, repeat cath 01/2011 with patent stents and moderate disease elsewhere.  Last carotid doppler 10/2021: RICA 40-45% & LICA 1-39%  Patient is here for follow-up.  He does yard work and [employer] without any issue.  Denies chest pain, shortness of breath, orthopnea, PND, syncope, lower extremity edema or melena.  Past Medical History:  Diagnosis Date   ANXIETY    Carotid artery stenosis    0 to 50% right carotid artery stenosis and 1 to 39% left carotid artery stenosis with possible near occlusion of the left vertebral artery   CARPAL TUNNEL SYNDROME, RIGHT    COPD    Coronary artery disease    s/p bare metal stens X 2 to the RCA, 09/28/2008 EF45-50%.  ath 01/2011 with patent RCA stents and moderate disese elsewhere   DEGENERATIVE DISC DISEASE, CERVICAL SPINE, W/RADICULOPATHY    DEPRESSION    GANGLION CYST, WRIST, LEFT    HYPERCHOLESTEROLEMIA    HYPERGLYCEMIA    Hypertension    LUNG NODULES    MI    PERIPHERAL VASCULAR DISEASE WITH CLAUDICATION, LEFT LEG    PLANTAR FASCIITIS, LEFT    REACTIVE AIRWAY DISEASE    ROTATOR CUFF SYNDROME, LEFT    TESTICULAR HYPOFUNCTION    THYROMEGALY     Past Surgical History:  Procedure Laterality Date   ANGIOPLASTY / STENTING FEMORAL     CARDIAC CATHETERIZATION     01/2011 cardiac cath with patent stents and normal EF    Current Medications: Current Meds  Medication Sig   amphetamine-dextroamphetamine (ADDERALL) 15 MG tablet Take 15 mg by mouth daily.   Ascorbic Acid  (VITAMIN C) 1000 MG tablet Take 1,000 mg by mouth at bedtime.    aspirin 81 MG tablet Take 1 tablet (81 mg total) by mouth every morning.   atorvastatin (LIPITOR) 80 MG tablet TAKE 1 TABLET(80 MG) BY MOUTH DAILY   B Complex Vitamins (VITAMIN B-COMPLEX PO) Take 1 tablet by mouth daily.   cholecalciferol (VITAMIN D) 1000 UNITS tablet Take 1,000 Units by mouth every evening.     clopidogrel (PLAVIX) 75 MG tablet TAKE 1 TABLET(75 MG) BY MOUTH DAILY   cyclobenzaprine (FLEXERIL) 10 MG tablet Take 1 tablet (10 mg total) by mouth 2 (two) times daily as needed for muscle spasms.   famotidine (PEPCID) 10 MG tablet Take 10 mg by mouth as needed for heartburn or indigestion.   gabapentin (NEURONTIN) 600 MG tablet Take 600 mg by mouth 3 (three) times daily.     isosorbide mononitrate (IMDUR) 30 MG 24 hr tablet TAKE 1 TABLET BY MOUTH EVERY DAY   metoprolol tartrate (LOPRESSOR) 50 MG tablet TAKE 1/2 TABLET(25 MG) BY MOUTH TWICE DAILY   nitroGLYCERIN (NITROSTAT) 0.6 MG SL tablet PLACE 1 TABLET UNDER THE TONGUE EVERY 5 MINUTES AS NEEDED FOR CHEST PAIN   SUBOXONE 8-2 MG FILM Place 0.5-1 application under the tongue daily. 1 in the morning and 1/2 in the afternoon  Allergies:   Nsaids   Social History   Socioeconomic History   Marital status: Single    Spouse name: Not on file   Number of children: Not on file   Years of education: Not on file   Highest education level: Not on file  Occupational History   Not on file  Tobacco Use   Smoking status: Former   Smokeless tobacco: Never  Vaping Use   Vaping Use: Never used  Substance and Sexual Activity   Alcohol use: No   Drug use: No   Sexual activity: Yes  Other Topics Concern   Not on file  Social History Narrative   Not on file   Social Determinants of Health   Financial Resource Strain: Not on file  Food Insecurity: Not on file  Transportation Needs: Not on file  Physical Activity: Not on file  Stress: Not on file  Social Connections:  Not on file     Family History: The patient's family history includes Diabetes in his father; Heart failure in his father.    ROS:   Please see the history of present illness.    All other systems reviewed and are negative.   EKGs/Labs/Other Studies Reviewed:    The following studies were reviewed today:  Carotid doppler 10/2021 Summary:  Right Carotid: Velocities in the right ICA are consistent with a 40-59%  stenosis. Non-hemodynamically significant plaque <50% noted in  the CCA.   Left Carotid: Velocities in the left ICA are consistent with a 1-39%  stenosis. Non-hemodynamically significant plaque <50% noted in the  CCA.   Vertebrals:  Right vertebral artery demonstrates antegrade flow. Left  vertebral               artery is antegrade and atypical; question near occlusion.  Subclavians: Normal flow hemodynamics were seen in bilateral subclavian               arteries.   EKG:  EKG is  ordered today.  The ekg ordered today demonstrates normal sinus rhythm  Recent Labs: No results found for requested labs within last 365 days.  Recent Lipid Panel    Component Value Date/Time   CHOL 119 11/25/2020 1343   TRIG 44 11/25/2020 1343   HDL 57 11/25/2020 1343   CHOLHDL 2.1 11/25/2020 1343   CHOLHDL 2.1 02/02/2019 0805   VLDL 10 02/02/2019 0805   LDLCALC 51 11/25/2020 1343    Physical Exam:    VS:  BP 108/66   Pulse 74   Ht 5' 7.5" (1.715 m)   Wt 137 lb (62.1 kg)   SpO2 96%   BMI 21.14 kg/m     Wt Readings from Last 3 Encounters:  01/02/22 137 lb (62.1 kg)  11/12/20 137 lb (62.1 kg)  10/06/19 149 lb (67.6 kg)     GEN:  Well nourished, well developed in no acute distress HEENT: Normal, right eye blindness NECK: No JVD; No carotid bruits LYMPHATICS: No lymphadenopathy CARDIAC: RRR, no murmurs, rubs, gallops RESPIRATORY:  Clear to auscultation without rales, wheezing or rhonchi  ABDOMEN: Soft, non-tender, non-distended MUSCULOSKELETAL:  No edema; No deformity   SKIN: Warm and dry NEUROLOGIC:  Alert and oriented x 3 PSYCHIATRIC:  Normal affect   ASSESSMENT AND PLAN:    CAD No angina.  He is very active at baseline.  Plavix started after his right eye stroke.  Continue aspirin, statin, Imdur and beta-blocker.  2.  History of right eye stroke -Vision loss since then -On  Plavix  3. HTN - BP stable  4. HLD - No results found for requested labs within last 365 days.  - Followed by PCP - Continue statin   5. Carotid artery dx - Last carotid doppler 10/2021: RICA 40-45% & LICA 1-39% - Repeat in 1 yr  Medication Adjustments/Labs and Tests Ordered: Current medicines are reviewed at length with the patient today.  Concerns regarding medicines are outlined above.  Orders Placed This Encounter  Procedures   EKG 12-Lead   No orders of the defined types were placed in this encounter.   Patient Instructions  Medication Instructions:   Your physician recommends that you continue on your current medications as directed. Please refer to the Current Medication list given to you today.  *If you need a refill on your cardiac medications before your next appointment, please call your pharmacy*    Follow-Up: At [facility], you and your health needs are our priority.  As part of our continuing mission to provide you with exceptional heart care, we have created designated Provider Care Teams.  These Care Teams include your primary Cardiologist (physician) and Advanced Practice Providers (APPs -  Physician Assistants and Nurse Practitioners) who all work together to provide you with the care you need, when you need it.  We recommend signing up for the patient portal called "MyChart".  Sign up information is provided on this After Visit Summary.  MyChart is used to connect with patients for Virtual Visits (Telemedicine).  Patients are able to view lab/test results, encounter notes, upcoming appointments, etc.  Non-urgent messages can be sent  to your provider as well.   To learn more about what you can do with MyChart, go to [email or web address].    Your next appointment:   1 year(s)  The format for your next appointment:   In Person  Provider:   Armanda Magic, MD     Important Information About Sugar         Lorelei Pont, PA  01/02/2022 3:52 PM    [facility]

## 2022-01-02 NOTE — Patient Instructions (Signed)
Medication Instructions:  Your physician recommends that you continue on your current medications as directed. Please refer to the Current Medication list given to you today. ' *If you need a refill on your cardiac medications before your next appointment, please call your pharmacy*   Follow-Up: At [facility], you and your health needs are our priority.  As part of our continuing mission to provide you with exceptional heart care, we have created designated Provider Care Teams.  These Care Teams include your primary Cardiologist (physician) and Advanced Practice Providers (APPs -  Physician Assistants and Nurse Practitioners) who all work together to provide you with the care you need, when you need it.  We recommend signing up for the patient portal called "MyChart".  Sign up information is provided on this After Visit Summary.  MyChart is used to connect with patients for Virtual Visits (Telemedicine).  Patients are able to view lab/test results, encounter notes, upcoming appointments, etc.  Non-urgent messages can be sent to your provider as well.   To learn more about what you can do with MyChart, go to [email or web address].    Your next appointment:   1 year(s)  The format for your next appointment:   In Person  Provider:   Traci Turner, MD     Important Information About Sugar       

## 2022-01-18 ENCOUNTER — Other Ambulatory Visit (HOSPITAL_COMMUNITY): Payer: Self-pay | Admitting: Physician Assistant

## 2022-01-18 DIAGNOSIS — I779 Disorder of arteries and arterioles, unspecified: Secondary | ICD-10-CM

## 2022-01-23 ENCOUNTER — Other Ambulatory Visit: Payer: Self-pay | Admitting: Cardiology

## 2022-01-23 DIAGNOSIS — I2510 Atherosclerotic heart disease of native coronary artery without angina pectoris: Secondary | ICD-10-CM

## 2022-01-24 ENCOUNTER — Other Ambulatory Visit: Payer: Self-pay | Admitting: Cardiology

## 2022-02-27 ENCOUNTER — Other Ambulatory Visit: Payer: Self-pay | Admitting: Cardiology

## 2022-03-31 ENCOUNTER — Other Ambulatory Visit (HOSPITAL_BASED_OUTPATIENT_CLINIC_OR_DEPARTMENT_OTHER): Payer: Self-pay

## 2022-03-31 MED ORDER — COMIRNATY 30 MCG/0.3ML IM SUSY
PREFILLED_SYRINGE | INTRAMUSCULAR | 0 refills | Status: AC
  Filled 2022-03-31: qty 0.3, 1d supply, fill #0

## 2022-04-25 ENCOUNTER — Other Ambulatory Visit: Payer: Self-pay | Admitting: Cardiology

## 2022-07-25 ENCOUNTER — Other Ambulatory Visit: Payer: Self-pay | Admitting: Cardiology

## 2022-07-25 DIAGNOSIS — I2510 Atherosclerotic heart disease of native coronary artery without angina pectoris: Secondary | ICD-10-CM

## 2022-11-30 ENCOUNTER — Ambulatory Visit (HOSPITAL_COMMUNITY): Admission: RE | Admit: 2022-11-30 | Payer: Medicare Other | Source: Ambulatory Visit

## 2022-12-13 ENCOUNTER — Other Ambulatory Visit: Payer: Self-pay | Admitting: Cardiology

## 2022-12-14 ENCOUNTER — Ambulatory Visit (HOSPITAL_COMMUNITY): Payer: Medicare Other

## 2022-12-14 MED ORDER — METOPROLOL TARTRATE 50 MG PO TABS: ORAL_TABLET | ORAL | 0 refills | Status: DC

## 2022-12-14 NOTE — Addendum Note (Signed)
Addended by: Mariam Dollar on: 12/14/2022 06:53 AM   Modules accepted: Orders

## 2022-12-18 ENCOUNTER — Ambulatory Visit (HOSPITAL_COMMUNITY)
Admission: RE | Admit: 2022-12-18 | Discharge: 2022-12-18 | Disposition: A | Discharge status: Home / Self Care | Payer: Medicare Other | Source: Ambulatory Visit | Attending: Cardiology | Admitting: Cardiology

## 2022-12-18 DIAGNOSIS — I779 Disorder of arteries and arterioles, unspecified: Secondary | ICD-10-CM | POA: Diagnosis present

## 2022-12-18 DIAGNOSIS — I6521 Occlusion and stenosis of right carotid artery: Secondary | ICD-10-CM | POA: Diagnosis present

## 2022-12-18 LAB — COLOGUARD: COLOGUARD: NEGATIVE

## 2022-12-21 ENCOUNTER — Telehealth: Payer: Self-pay

## 2022-12-21 ENCOUNTER — Encounter: Payer: Self-pay | Admitting: Cardiology

## 2022-12-21 DIAGNOSIS — I6529 Occlusion and stenosis of unspecified carotid artery: Secondary | ICD-10-CM

## 2022-12-21 NOTE — Telephone Encounter (Signed)
Reviewed results of carotid scan with patient who verbalizes understanding. Patient denies any right arm numbness or tingling. Patient responses forwarded to Dr. Mayford Knife. Order placed for 1 yr follow up carotid scan.

## 2023-01-02 NOTE — Progress Notes (Unsigned)
Cardiology Office Note    Patient Name: [name]. Date of Encounter: 01/02/2023  Primary Care Provider:  Charlies Silvers, PA-C Primary Cardiologist:  Armanda Magic, MD Primary Electrophysiologist: None   Past Medical History    Past Medical History:  Diagnosis Date   ANXIETY    Carotid artery stenosis    40-59 right carotid artery stenosis and 1 to 39% left carotid artery stenosis with nonvisualized left vertebral artery by Dopplers 11/2022   CARPAL TUNNEL SYNDROME, RIGHT    COPD    Coronary artery disease    s/p bare metal stens X 2 to the RCA, 09/28/2008 EF45-50%.  ath 01/2011 with patent RCA stents and moderate disese elsewhere   DEGENERATIVE DISC DISEASE, CERVICAL SPINE, W/RADICULOPATHY    DEPRESSION    GANGLION CYST, WRIST, LEFT    HYPERCHOLESTEROLEMIA    HYPERGLYCEMIA    Hypertension    LUNG NODULES    MI    PERIPHERAL VASCULAR DISEASE WITH CLAUDICATION, LEFT LEG    PLANTAR FASCIITIS, LEFT    REACTIVE AIRWAY DISEASE    ROTATOR CUFF SYNDROME, LEFT    TESTICULAR HYPOFUNCTION    THYROMEGALY     History of Present Illness  [name]. is a 67 y.o. male with a PMH of CAD s/p NSTEMI with BMS x 2 to RCA in 09/2008 repeat cath 01/2011 with patent stents and moderate disease surrounding the arteries, right eye CVA with vision loss 01/2019 HTN, HLD, carotid artery stenosis (bilateral 40-59%) who presents today for 1 year follow-up.  [name] has been followed since 2010 for management of CAD.  A NSTEMI that was treated with BMS x 2 in the RCA relook cath and 2012 showing patent stents and moderate disease elsewhere.  He had carotid Doppler completed 02/2020 showed bilateral 40-59% stenosis with repeat Doppler 02/2021 with no change.  He was last seen by Dr. Mayford Knife on 11/12/2020 and was seen most recently on 01/02/2022 for follow-up.  During follow-up patient was staying active and doing yard work.  He reported no evidence of angina and BP was  controlled.  He was on Plavix due to vision loss suffered from right eye stroke.  During today's visit the patient reports he has been doing well with no new cardiac complaints since his previous visit.  He does note 2 isolated episodes of shortness of breath that occurred while doing very strenuous activities such as moving a washing machine and helping to move furniture.  His blood pressure today is controlled at 118/58 and heart rate is 73 bpm.  He is compliant with his current medications and denies any adverse reactions.  He is maintaining an active lifestyle and does yard work such as weed eating and mowing without any difficulties.  Patient denies chest pain, palpitations, dyspnea, PND, orthopnea, nausea, vomiting, dizziness, syncope, edema, weight gain, or early satiety.  Review of Systems  Please see the history of present illness.    All other systems reviewed and are otherwise negative except as noted above.  Physical Exam    Wt Readings from Last 3 Encounters:  01/02/22 137 lb (62.1 kg)  11/12/20 137 lb (62.1 kg)  10/06/19 149 lb (67.6 kg)   JY:NWGNF were no vitals filed for this visit.,There is no height or weight on file to calculate BMI. GEN: Well nourished, well developed in no acute distress Neck: No JVD; No carotid bruits Pulmonary: Clear to auscultation without rales, wheezing or rhonchi  Cardiovascular: Normal rate. Regular  rhythm. Normal S1. Normal S2.   Murmurs: There is no murmur.  ABDOMEN: Soft, non-tender, non-distended EXTREMITIES:  No edema; No deformity   EKG/LABS/ Recent Cardiac Studies   ECG personally reviewed by me today -sinus rhythm with rate of 73 bpm and no acute changes consistent with previous EKG.  Risk Assessment/Calculations:          Lab Results  Component Value Date   WBC 8.4 04/27/2019   HGB 12.0 (L) 04/27/2019   HCT 36.9 (L) 04/27/2019   MCV 90.7 04/27/2019   PLT 206 04/27/2019   Lab Results  Component Value Date   CREATININE 0.68  04/27/2019   BUN 10 04/27/2019   NA 137 04/27/2019   K 4.5 04/27/2019   CL 104 04/27/2019   CO2 27 04/27/2019   Lab Results  Component Value Date   CHOL 119 11/25/2020   HDL 57 11/25/2020   LDLCALC 51 11/25/2020   TRIG 44 11/25/2020   CHOLHDL 2.1 11/25/2020    Lab Results  Component Value Date   HGBA1C 5.7 (H) 02/02/2019   Assessment & Plan    1.  Coronary artery disease: -s/p NSTEMI with BMS x 2 to RCA in 2010 cath 01/2011 showing patent stents and moderate disease surrounding arteries -Today patient reports no chest pain but reports 2 isolated episodes of shortness of breath with heavy exertion -Continue current GDMT with ASA 81 mg, Lipitor 80 mg daily, Plavix 75 mg daily 30 mg daily with as needed 30 mg of metoprolol 25 mg twice daily, as needed Nitrostat 0.4 mg  2.  Essential hypertension: -Patient's blood pressure today was controlled at 118/58 -Continue Lopressor 25 mg twice daily  3.  Hyperlipidemia: -Patient's last LDL cholesterol was 51 -Continue Lipitor 80 mg daily  4.  Carotid artery stenosis: -carotid dopplers 02/2020 showed 40-59% bilaterally -repeat dopplers 02/2021 -continue ASA and statin  5.  Right eye stroke: -01/2019 currently on Plavix and ASA 81 mg -Patient reports no residual effects or concerns today.  Disposition: Follow-up with Armanda Magic, MD or APP in 12 months    Signed, Napoleon Form, Leodis Rains, NP 01/02/2023, 9:08 PM [facility]

## 2023-01-03 ENCOUNTER — Encounter: Payer: Self-pay | Admitting: Nurse Practitioner

## 2023-01-03 ENCOUNTER — Ambulatory Visit: Payer: Medicare Other | Attending: Nurse Practitioner | Admitting: Nurse Practitioner

## 2023-01-03 VITALS — BP 118/58 | HR 73 | Ht 67.5 in | Wt 131.4 lb

## 2023-01-03 DIAGNOSIS — I6529 Occlusion and stenosis of unspecified carotid artery: Secondary | ICD-10-CM | POA: Diagnosis not present

## 2023-01-03 DIAGNOSIS — I2510 Atherosclerotic heart disease of native coronary artery without angina pectoris: Secondary | ICD-10-CM

## 2023-01-03 DIAGNOSIS — I2583 Coronary atherosclerosis due to lipid rich plaque: Secondary | ICD-10-CM

## 2023-01-03 DIAGNOSIS — H5461 Unqualified visual loss, right eye, normal vision left eye: Secondary | ICD-10-CM

## 2023-01-03 DIAGNOSIS — I10 Essential (primary) hypertension: Secondary | ICD-10-CM

## 2023-01-03 DIAGNOSIS — E7800 Pure hypercholesterolemia, unspecified: Secondary | ICD-10-CM | POA: Diagnosis not present

## 2023-01-03 MED ORDER — ISOSORBIDE MONONITRATE ER 30 MG PO TB24: 30.0000 mg | ORAL_TABLET | Freq: Every day | ORAL | 2 refills | Status: DC

## 2023-01-03 NOTE — Patient Instructions (Signed)
Medication Instructions:  RESTART Aspirin 81mg  take 1 tablet once a day You can take an additional 30mg  of Imdur as needed for chest pain *If you need a refill on your cardiac medications before your next appointment, please call your pharmacy*   Lab Work: None ordered   Testing/Procedures: None ordered   Follow-Up: At [facility], you and your health needs are our priority.  As part of our continuing mission to provide you with exceptional heart care, we have created designated Provider Care Teams.  These Care Teams include your primary Cardiologist (physician) and Advanced Practice Providers (APPs -  Physician Assistants and Nurse Practitioners) who all work together to provide you with the care you need, when you need it.  We recommend signing up for the patient portal called "MyChart".  Sign up information is provided on this After Visit Summary.  MyChart is used to connect with patients for Virtual Visits (Telemedicine).  Patients are able to view lab/test results, encounter notes, upcoming appointments, etc.  Non-urgent messages can be sent to your provider as well.   To learn more about what you can do with MyChart, go to [email or web address].    Your next appointment:   12 month(s)  Provider:   Armanda Magic, MD     Other Instructions

## 2023-01-12 ENCOUNTER — Other Ambulatory Visit: Payer: Self-pay | Admitting: Cardiology

## 2023-01-17 ENCOUNTER — Other Ambulatory Visit: Payer: Self-pay | Admitting: Cardiology

## 2023-01-17 DIAGNOSIS — I2510 Atherosclerotic heart disease of native coronary artery without angina pectoris: Secondary | ICD-10-CM

## 2023-02-19 ENCOUNTER — Other Ambulatory Visit: Payer: Self-pay | Admitting: Cardiology

## 2023-10-11 ENCOUNTER — Other Ambulatory Visit: Payer: Self-pay | Admitting: Nurse Practitioner

## 2023-11-12 ENCOUNTER — Encounter (HOSPITAL_COMMUNITY): Payer: Self-pay

## 2024-01-02 NOTE — Progress Notes (Unsigned)
 Cardiology Office Note    Date:  01/03/2024  ID:  Alex LELON Mody Sr Sr., DOB [birth year], MRN [id number] PCP:  Jeremy Nest, PA-C  Cardiologist:  Wilbert Bihari, MD  Electrophysiologist:  None   Chief Complaint: Follow up for CAD   History of Present Illness: .    Alex Bautch Hornaday Sr Sr. is a 68 y.o. male with visit-pertinent history of CAD s/p NSTEMI with BMS x 2 to RCA in 09/2008, repeat cath in 01/2011 with patent stents and moderate disease surrounding the arteries, right eye CVA with vision loss in 01/2019, hypertension, hyperlipidemia, carotid artery stenosis.  Patient has been followed since 2010 for management of CAD.  Patient with history of NSTEMI in 2010 treated with BMS x 2 in RCA, patient had relook cath in 2012 showing patent stents and moderate disease elsewhere.  Patient was last seen in clinic on 01/03/2023 by [name], NP.  Patient reported that he been doing well with no new cardiac concerns or complaints.  He reported 2 episodes of isolated shortness of breath with very strenuous activity.  Carotid duplex in 11/2022 indicated velocities in right ICA consistent with 40 to 59% stenosis, left ICA consistent with a 1 to 39% stenosis, right subclavian artery flow was disturbed.  Today he presents for follow-up.  He reports that he is doing well, notes he is recovering from pneumonia, notes he is improving and almost back to his baseline.  He reports that he had increased cough last month while acutely ill, associated with some increased shortness of breath that is improving and is back close to his baseline.  He denies any chest pain, lower extremity edema, orthopnea or PND.  He denies any palpitations, presyncope or syncope.  Labwork independently reviewed: 10/30/2023: Hemoglobin 12.2, hematocrit 36.2, sodium 138, Tessman 4.4, creatinine 0.67, AST 28, ALT 19, hemoglobin A1c 5.4% ROS: .   Today he denies chest pain, shortness of breath, lower extremity edema, fatigue,  palpitations, melena, hematuria, hemoptysis, diaphoresis, weakness, presyncope, syncope, orthopnea, and PND.  All other systems are reviewed and otherwise negative. Studies Reviewed: SABRA    EKG:  EKG is ordered today, personally reviewed, demonstrating  EKG Interpretation Date/Time:  Thursday January 03 2024 15:29:55 EDT Ventricular Rate:  79 PR Interval:    QRS Duration:  76 QT Interval:  406 QTC Calculation: 465 R Axis:   72  Text Interpretation: Normal sinus rhythm Nonspecific T wave abnormality Prolonged QT When compared with ECG of 03-Jan-2023 15:21, No significant change was found Reconfirmed by Allex Lapoint [phone]) on 01/03/2024 10:22:45 PM   CV Studies: Cardiac studies reviewed are outlined and summarized above. Otherwise please see EMR for full report. Cardiac Studies & Procedures   ______________________________________________________________________________________________   STRESS TESTS  NM MYOCAR MULTI W/SPECT W 11/29/2009  Narrative Clinical Data:  Coronary artery disease, chest pain and hypertension.  MYOCARDIAL IMAGING WITH SPECT (REST AND PHARMACOLOGIC-STRESS) GATED LEFT VENTRICULAR WALL MOTION STUDY LEFT VENTRICULAR EJECTION FRACTION  Technique:  Standard myocardial SPECT imaging was performed after resting intravenous injection of 10 mCi Tc-25m tetrofosmin. Subsequently, intravenous infusion of Lexiscan was performed under the supervision of the Cardiology staff.  At peak effect of the drug, 30 mCi Tc-63m tetrofosmin was injected intravenously and standard myocardial SPECT  imaging was performed.  Quantitative gated imaging was also performed to evaluate left ventricular wall motion, and estimate left ventricular ejection fraction.  Comparison: Prior study dated 03/05/2009  Findings: Utilizing gated data the end-diastolic volume is estimated be 91 ml  and the end-systolic volume 38 ml.  Calculated ejection fraction is 59%.  Gated wall motion analysis is  unremarkable.  No regional wall motion abnormalities.  SPECT imaging shows stable fixed perfusion defect in the inferior wall extending from the mid to basal aspect.  No evidence of inducible ischemia.  IMPRESSION: Stable inferior wall fixed perfusion defect consistent with scar. No evidence of inducible ischemia.  Normal left ventricular function with calculated ejection fraction of 59%.  Provider: Channing Arias, Newell Lesches, Amber Pitts   ECHOCARDIOGRAM  ECHOCARDIOGRAM COMPLETE 02/02/2019  Narrative ECHOCARDIOGRAM REPORT    Patient Name:   [name] [facility] Date of Exam: 02/02/2019 Medical Rec #:  [id number]         Height:       67.5 in Accession #:    [id number]        Weight:       141.6 lb Date of Birth:  1955-07-31         BSA:          1.76 m Patient Age:    63 years          BP:           117/73 mmHg Patient Gender: M                 HR:           64 bpm. Exam Location:  Inpatient  Procedure: 2D Echo  Indications:    stroke 434.91  History:        Patient has prior history of Echocardiogram examinations, most recent 10/15/2006. CAD; COPD Risk Factors:Hypertension.  Sonographer:    Tinnie Barefoot Referring Phys: [id number] MOHAMMAD L GARBA   Sonographer Comments: Image acquisition challenging due to respiratory motion. IMPRESSIONS   1. Left ventricular ejection fraction, by visual estimation, is 65 to 70%. The left ventricle has normal function. Normal left ventricular size. There is no left ventricular hypertrophy. 2. Global right ventricle has normal systolic function.The right ventricular size is normal. No increase in right ventricular wall thickness. 3. Left atrial size was normal. 4. Right atrial size was normal. 5. The mitral valve is normal in structure. No evidence of mitral valve regurgitation. No evidence of mitral stenosis. 6. The tricuspid valve is normal in structure. Tricuspid valve regurgitation is mild. 7. The aortic valve is normal in  structure. Aortic valve regurgitation was not visualized by color flow Doppler. Structurally normal aortic valve, with no evidence of sclerosis or stenosis. 8. The pulmonic valve was normal in structure. Pulmonic valve regurgitation is not visualized by color flow Doppler. 9. The inferior vena cava is normal in size with greater than 50% respiratory variability, suggesting right atrial pressure of 3 mmHg. 10. No intracardiac source of emboli.  FINDINGS Left Ventricle: Left ventricular ejection fraction, by visual estimation, is 65 to 70%. The left ventricle has normal function. There is no left ventricular hypertrophy. Normal left ventricular size. Spectral Doppler shows Left ventricular diastolic parameters were normal pattern of LV diastolic filling.  Right Ventricle: The right ventricular size is normal. No increase in right ventricular wall thickness. Global RV systolic function is has normal systolic function.  Left Atrium: Left atrial size was normal in size.  Right Atrium: Right atrial size was normal in size  Pericardium: There is no evidence of pericardial effusion.  Mitral Valve: The mitral valve is normal in structure. No evidence of mitral valve stenosis by observation. No evidence of mitral valve regurgitation.  Tricuspid Valve:  The tricuspid valve is normal in structure. Tricuspid valve regurgitation is mild by color flow Doppler.  Aortic Valve: The aortic valve is normal in structure. Aortic valve regurgitation was not visualized by color flow Doppler. The aortic valve is structurally normal, with no evidence of sclerosis or stenosis.  Pulmonic Valve: The pulmonic valve was normal in structure. Pulmonic valve regurgitation is not visualized by color flow Doppler.  Aorta: The aortic root, ascending aorta and aortic arch are all structurally normal, with no evidence of dilitation or obstruction.  Venous: The inferior vena cava is normal in size with greater than 50%  respiratory variability, suggesting right atrial pressure of 3 mmHg.  IAS/Shunts: No atrial level shunt detected by color flow Doppler. No ventricular septal defect is seen or detected. There is no evidence of an atrial septal defect.   Additional Comments: No intracardiac source of emboli.  LEFT VENTRICLE PLAX 2D LVOT diam:     1.80 cm  Diastology LVOT Area:     2.54 cm LV e' lateral:   15.00 cm/s LV E/e' lateral: 5.1 LV e' medial:    9.36 cm/s LV E/e' medial:  8.2   RIGHT VENTRICLE RV S prime:     15.60 cm/s TAPSE (M-mode): 2.4 cm  LEFT ATRIUM             Index       RIGHT ATRIUM           Index LA diam:        3.30 cm 1.88 cm/m  RA Area:     14.40 cm LA Vol (A2C):   47.1 ml 26.82 ml/m RA Volume:   34.80 ml  19.82 ml/m LA Vol (A4C):   33.8 ml 19.25 ml/m LA Biplane Vol: 40.4 ml 23.01 ml/m AORTIC VALVE LVOT Vmax:   89.40 cm/s LVOT Vmean:  55.800 cm/s LVOT VTI:    0.190 m  AORTA Ao Root diam: 3.20 cm  MITRAL VALVE MV Area (PHT): 3.08 cm             SHUNTS MV PHT:        71.34 msec           Systemic VTI:  0.19 m MV Decel Time: 246 msec             Systemic Diam: 1.80 cm MV E velocity: 76.30 cm/s 103 cm/s MV A velocity: 76.30 cm/s 70.3 cm/s MV E/A ratio:  1.00       1.5   Leim Moose MD Electronically signed by Leim Moose MD Signature Date/Time: 02/02/2019/5:26:03 PM    Final          ______________________________________________________________________________________________       Current Reported Medications:.    Current Meds  Medication Sig   amphetamine -dextroamphetamine  (ADDERALL) 15 MG tablet Take 15 mg by mouth daily.   Ascorbic Acid  (VITAMIN C ) 1000 MG tablet Take 1,000 mg by mouth at bedtime.    aspirin  81 MG tablet Take 1 tablet (81 mg total) by mouth every morning.   atorvastatin  (LIPITOR ) 80 MG tablet TAKE 1 TABLET(80 MG) BY MOUTH DAILY   B Complex Vitamins (VITAMIN B-COMPLEX PO) Take 1 tablet by mouth daily.    cholecalciferol  (VITAMIN D ) 1000 UNITS tablet Take 1,000 Units by mouth every evening.     clopidogrel  (PLAVIX ) 75 MG tablet TAKE 1 TABLET(75 MG) BY MOUTH DAILY   COVID-19 mRNA vaccine 2023-2024 (COMIRNATY ) syringe Inject into the muscle.   cyclobenzaprine  (FLEXERIL ) 10 MG tablet Take 1  tablet (10 mg total) by mouth 2 (two) times daily as needed for muscle spasms.   famotidine  (PEPCID ) 10 MG tablet Take 10 mg by mouth as needed for heartburn or indigestion.   gabapentin  (NEURONTIN ) 600 MG tablet Take 600 mg by mouth 3 (three) times daily.     isosorbide  mononitrate (IMDUR ) 30 MG 24 hr tablet TAKE 1 TABLET BY MOUTH DAILY. MAY TAKE AN ADDITIONAL TABLET AS NEEDED FOR CHEST PAIN   metoprolol  tartrate (LOPRESSOR ) 50 MG tablet TAKE 1/2 TABLET BY MOUTH TWICE DAILY   nitroGLYCERIN  (NITROSTAT ) 0.6 MG SL tablet PLACE 1 TABLET UNDER THE TONGUE EVERY 5 MINUTES AS NEEDED FOR CHEST PAIN   SUBOXONE  8-2 MG FILM Place 0.5-1 application under the tongue daily. 1 in the morning and 1/2 in the afternoon   Physical Exam:    VS:  BP 113/65   Pulse 79   Ht 5' 7.5 (1.715 m)   Wt 140 lb (63.5 kg)   SpO2 97%   BMI 21.60 kg/m    Wt Readings from Last 3 Encounters:  01/03/24 140 lb (63.5 kg)  01/03/23 131 lb 6.4 oz (59.6 kg)  01/02/22 137 lb (62.1 kg)    GEN: Well nourished, well developed in no acute distress NECK: No JVD; No carotid bruits CARDIAC: RRR, no murmurs, rubs, gallops RESPIRATORY:  Clear to auscultation without rales, wheezing or rhonchi  ABDOMEN: Soft, non-tender, non-distended EXTREMITIES:  No edema; No acute deformity     Asessement and Plan:.    CAD: S/p NSTEMI with BMS x 2 to RCA in 2010, relook cath in 01/2011 showed patent stents and moderate disease elsewhere. Stable with no anginal symptoms. No indication for ischemic evaluation.  Heart healthy diet and regular cardiovascular exercise encouraged.  Reviewed ED precautions.  Continue aspirin  81 mg daily, Lipitor  80 mg daily, Plavix  75 mg  daily, Imdur  30 mg daily metoprolol  tartrate 25 mg twice daily.  Hypertension: Blood pressure today 113/65.  Continue current antihypertensive regimen.  Hyperlipidemia: Last lipid profile on 10/30/2023 indicated total cholesterol 126, triglycerides 80, HDL 57 and LDL 53.  Continue Lipitor  80 mg daily.  Carotid artery stenosis: Carotid duplex in 11/2022 indicated velocities in right ICA consistent with 40 to 59% stenosis, left ICA consistent with a 1 to 39% stenosis, right subclavian artery flow was disturbed.  Check carotid Dopplers.  History of right eye stroke: Occurred in 01/2019, patient denies any recurrence.  On aspirin , Plavix  and Lipitor .   Disposition: F/u with Dr. Shlomo in one year or sooner if needed.   Signed, Yony Roulston D Ricky Gallery, NP

## 2024-01-03 ENCOUNTER — Ambulatory Visit: Attending: Cardiology | Admitting: Cardiology

## 2024-01-03 ENCOUNTER — Encounter: Payer: Self-pay | Admitting: Cardiology

## 2024-01-03 VITALS — BP 113/65 | HR 79 | Ht 67.5 in | Wt 140.0 lb

## 2024-01-03 DIAGNOSIS — I2510 Atherosclerotic heart disease of native coronary artery without angina pectoris: Secondary | ICD-10-CM | POA: Diagnosis not present

## 2024-01-03 DIAGNOSIS — I252 Old myocardial infarction: Secondary | ICD-10-CM | POA: Diagnosis not present

## 2024-01-03 DIAGNOSIS — I6529 Occlusion and stenosis of unspecified carotid artery: Secondary | ICD-10-CM | POA: Diagnosis present

## 2024-01-03 DIAGNOSIS — I2583 Coronary atherosclerosis due to lipid rich plaque: Secondary | ICD-10-CM | POA: Insufficient documentation

## 2024-01-03 DIAGNOSIS — I10 Essential (primary) hypertension: Secondary | ICD-10-CM | POA: Diagnosis present

## 2024-01-03 DIAGNOSIS — E782 Mixed hyperlipidemia: Secondary | ICD-10-CM | POA: Diagnosis not present

## 2024-01-03 DIAGNOSIS — H5461 Unqualified visual loss, right eye, normal vision left eye: Secondary | ICD-10-CM | POA: Diagnosis not present

## 2024-01-03 NOTE — Patient Instructions (Signed)
 Medication Instructions:  Your physician recommends that you continue on your current medications as directed. Please refer to the Current Medication list given to you today.  *If you need a refill on your cardiac medications before your next appointment, please call your pharmacy*  Lab Work: NONE If you have labs (blood work) drawn today and your tests are completely normal, you will receive your results only by: MyChart Message (if you have MyChart) OR A paper copy in the mail If you have any lab test that is abnormal or we need to change your treatment, we will call you to review the results.  Testing/Procedures: Your physician has requested that you have a carotid duplex. This test is an ultrasound of the carotid arteries in your neck. It looks at blood flow through these arteries that supply the brain with blood. Allow one hour for this exam. There are no restrictions or special instructions.   Follow-Up: At [facility], you and your health needs are our priority.  As part of our continuing mission to provide you with exceptional heart care, our providers are all part of one team.  This team includes your primary Cardiologist (physician) and Advanced Practice Providers or APPs (Physician Assistants and Nurse Practitioners) who all work together to provide you with the care you need, when you need it.  Your next appointment:   1 year(s)  Provider:   Wilbert Bihari, MD  We recommend signing up for the patient portal called MyChart.  Sign up information is provided on this After Visit Summary.  MyChart is used to connect with patients for Virtual Visits (Telemedicine).  Patients are able to view lab/test results, encounter notes, upcoming appointments, etc.  Non-urgent messages can be sent to your provider as well.   To learn more about what you can do with MyChart, go to [email or web address].

## 2024-01-04 ENCOUNTER — Encounter: Payer: Self-pay | Admitting: Cardiology

## 2024-01-06 ENCOUNTER — Other Ambulatory Visit: Payer: Self-pay | Admitting: Cardiology

## 2024-01-06 DIAGNOSIS — I2510 Atherosclerotic heart disease of native coronary artery without angina pectoris: Secondary | ICD-10-CM

## 2024-01-08 ENCOUNTER — Encounter: Payer: Self-pay | Admitting: Cardiology

## 2024-01-08 MED ORDER — ISOSORBIDE MONONITRATE ER 30 MG PO TB24: 30.0000 mg | ORAL_TABLET | Freq: Every day | ORAL | 3 refills | Status: AC

## 2024-01-08 MED ORDER — ATORVASTATIN CALCIUM 80 MG PO TABS: 80.0000 mg | ORAL_TABLET | Freq: Every day | ORAL | 3 refills | Status: AC

## 2024-01-08 MED ORDER — METOPROLOL TARTRATE 50 MG PO TABS: ORAL_TABLET | ORAL | 3 refills | Status: AC

## 2024-01-08 MED ORDER — CLOPIDOGREL BISULFATE 75 MG PO TABS: ORAL_TABLET | ORAL | 3 refills | Status: DC

## 2024-01-11 ENCOUNTER — Other Ambulatory Visit: Payer: Self-pay

## 2024-01-24 ENCOUNTER — Ambulatory Visit (HOSPITAL_COMMUNITY)
Admission: RE | Admit: 2024-01-24 | Discharge: 2024-01-24 | Disposition: A | Discharge status: Home / Self Care | Source: Ambulatory Visit | Attending: Cardiology | Admitting: Cardiology

## 2024-01-24 DIAGNOSIS — I2583 Coronary atherosclerosis due to lipid rich plaque: Secondary | ICD-10-CM | POA: Insufficient documentation

## 2024-01-24 DIAGNOSIS — I6529 Occlusion and stenosis of unspecified carotid artery: Secondary | ICD-10-CM | POA: Insufficient documentation

## 2024-01-24 DIAGNOSIS — I6523 Occlusion and stenosis of bilateral carotid arteries: Secondary | ICD-10-CM | POA: Diagnosis not present

## 2024-01-25 ENCOUNTER — Ambulatory Visit: Payer: Self-pay | Admitting: Emergency Medicine

## 2024-02-09 ENCOUNTER — Other Ambulatory Visit: Payer: Self-pay | Admitting: Nurse Practitioner

## 2024-04-04 ENCOUNTER — Other Ambulatory Visit: Payer: Self-pay | Admitting: Cardiology

## 2024-04-04 DIAGNOSIS — I2510 Atherosclerotic heart disease of native coronary artery without angina pectoris: Secondary | ICD-10-CM

## 2024-05-08 ENCOUNTER — Other Ambulatory Visit (HOSPITAL_BASED_OUTPATIENT_CLINIC_OR_DEPARTMENT_OTHER): Payer: Self-pay

## 2024-05-08 MED ORDER — FLUZONE HIGH-DOSE 0.5 ML IM SUSY
0.5000 mL | PREFILLED_SYRINGE | Freq: Once | INTRAMUSCULAR | 0 refills | Status: AC
  Filled 2024-05-08: qty 0.5, 1d supply, fill #0
